# Patient Record
Sex: Male | Born: 1960 | Race: White | Hispanic: No | Marital: Married | Smoking: Current every day smoker
Health system: Southern US, Community
[De-identification: ages and names within clinical notes are randomized; demographics above are authoritative.]

## PROBLEM LIST (undated history)

## (undated) DIAGNOSIS — Z86010 Personal history of colonic polyps: Secondary | ICD-10-CM

## (undated) DIAGNOSIS — I10 Essential (primary) hypertension: Secondary | ICD-10-CM

## (undated) HISTORY — PX: OTHER SURGICAL HISTORY: SHX169

## (undated) HISTORY — DX: Personal history of colonic polyps: Z86.010

## (undated) HISTORY — PX: EYE SURGERY: SHX253

## (undated) HISTORY — DX: Essential (primary) hypertension: I10

---

## 2005-01-16 ENCOUNTER — Emergency Department (HOSPITAL_COMMUNITY): Admission: EM | Admit: 2005-01-16 | Discharge: 2005-01-16 | Payer: Self-pay | Admitting: Emergency Medicine

## 2005-05-30 ENCOUNTER — Ambulatory Visit: Payer: Self-pay | Admitting: Internal Medicine

## 2005-06-06 ENCOUNTER — Ambulatory Visit: Payer: Self-pay | Admitting: Internal Medicine

## 2005-09-06 ENCOUNTER — Ambulatory Visit: Payer: Self-pay | Admitting: Internal Medicine

## 2007-04-02 ENCOUNTER — Ambulatory Visit: Payer: Self-pay | Admitting: Internal Medicine

## 2007-04-02 LAB — CONVERTED CEMR LAB
HCV Ab: NEGATIVE
Hep B Core Total Ab: NEGATIVE
Hep B S Ab: NEGATIVE
Hepatitis B Surface Ag: NEGATIVE

## 2007-12-18 ENCOUNTER — Ambulatory Visit: Payer: Self-pay | Admitting: Internal Medicine

## 2007-12-18 DIAGNOSIS — R21 Rash and other nonspecific skin eruption: Secondary | ICD-10-CM | POA: Insufficient documentation

## 2007-12-18 DIAGNOSIS — N529 Male erectile dysfunction, unspecified: Secondary | ICD-10-CM

## 2007-12-18 DIAGNOSIS — F17200 Nicotine dependence, unspecified, uncomplicated: Secondary | ICD-10-CM

## 2008-02-25 ENCOUNTER — Ambulatory Visit: Payer: Self-pay | Admitting: Internal Medicine

## 2008-02-25 DIAGNOSIS — R062 Wheezing: Secondary | ICD-10-CM | POA: Insufficient documentation

## 2008-02-28 LAB — CONVERTED CEMR LAB
ALT: 20 units/L (ref 0–53)
AST: 23 units/L (ref 0–37)
Alkaline Phosphatase: 64 units/L (ref 39–117)
Basophils Absolute: 0 10*3/uL (ref 0.0–0.1)
Bilirubin, Direct: 0.1 mg/dL (ref 0.0–0.3)
CO2: 29 meq/L (ref 19–32)
Chloride: 106 meq/L (ref 96–112)
Creatinine, Ser: 1.1 mg/dL (ref 0.4–1.5)
Eosinophils Absolute: 0.1 10*3/uL (ref 0.0–0.7)
GFR calc non Af Amer: 77 mL/min
Lymphocytes Relative: 34.4 % (ref 12.0–46.0)
MCHC: 35.8 g/dL (ref 30.0–36.0)
MCV: 95.3 fL (ref 78.0–100.0)
Neutrophils Relative %: 57.3 % (ref 43.0–77.0)
Platelets: 197 10*3/uL (ref 150–400)
Potassium: 4.5 meq/L (ref 3.5–5.1)
RBC: 4.85 M/uL (ref 4.22–5.81)
RDW: 11.9 % (ref 11.5–14.6)
Sodium: 140 meq/L (ref 135–145)
Total Bilirubin: 0.6 mg/dL (ref 0.3–1.2)
Vitamin B-12: 328 pg/mL (ref 211–911)

## 2008-03-24 ENCOUNTER — Ambulatory Visit: Payer: Self-pay | Admitting: Internal Medicine

## 2008-03-24 DIAGNOSIS — G4700 Insomnia, unspecified: Secondary | ICD-10-CM | POA: Insufficient documentation

## 2008-03-24 DIAGNOSIS — K219 Gastro-esophageal reflux disease without esophagitis: Secondary | ICD-10-CM

## 2008-04-02 ENCOUNTER — Ambulatory Visit: Payer: Self-pay | Admitting: Internal Medicine

## 2008-04-02 DIAGNOSIS — I10 Essential (primary) hypertension: Secondary | ICD-10-CM | POA: Insufficient documentation

## 2008-06-18 ENCOUNTER — Ambulatory Visit: Payer: Self-pay | Admitting: Internal Medicine

## 2008-09-16 ENCOUNTER — Ambulatory Visit: Payer: Self-pay | Admitting: Internal Medicine

## 2008-09-16 LAB — CONVERTED CEMR LAB
Chloride: 108 meq/L (ref 96–112)
GFR calc Af Amer: 117 mL/min
Glucose, Bld: 101 mg/dL — ABNORMAL HIGH (ref 70–99)
Potassium: 4.2 meq/L (ref 3.5–5.1)
Sodium: 139 meq/L (ref 135–145)

## 2008-09-17 ENCOUNTER — Ambulatory Visit: Payer: Self-pay | Admitting: Internal Medicine

## 2009-01-16 ENCOUNTER — Ambulatory Visit: Payer: Self-pay | Admitting: Internal Medicine

## 2009-04-08 ENCOUNTER — Ambulatory Visit: Payer: Self-pay | Admitting: Internal Medicine

## 2009-04-08 DIAGNOSIS — K6289 Other specified diseases of anus and rectum: Secondary | ICD-10-CM

## 2009-10-02 ENCOUNTER — Ambulatory Visit: Payer: Self-pay | Admitting: Internal Medicine

## 2009-10-02 LAB — CONVERTED CEMR LAB
ALT: 21 units/L (ref 0–53)
Albumin: 4.3 g/dL (ref 3.5–5.2)
BUN: 8 mg/dL (ref 6–23)
Basophils Relative: 0.1 % (ref 0.0–3.0)
CO2: 26 meq/L (ref 19–32)
Chloride: 104 meq/L (ref 96–112)
Cholesterol: 186 mg/dL (ref 0–200)
Direct LDL: 115.6 mg/dL
Eosinophils Absolute: 0.3 10*3/uL (ref 0.0–0.7)
Eosinophils Relative: 5.6 % — ABNORMAL HIGH (ref 0.0–5.0)
HCT: 48.4 % (ref 39.0–52.0)
Ketones, ur: NEGATIVE mg/dL
Leukocytes, UA: NEGATIVE
Lymphs Abs: 1.5 10*3/uL (ref 0.7–4.0)
MCHC: 34.3 g/dL (ref 30.0–36.0)
MCV: 100.4 fL — ABNORMAL HIGH (ref 78.0–100.0)
Monocytes Absolute: 0.5 10*3/uL (ref 0.1–1.0)
Potassium: 4.3 meq/L (ref 3.5–5.1)
RBC: 4.83 M/uL (ref 4.22–5.81)
Specific Gravity, Urine: 1.025 (ref 1.000–1.030)
TSH: 1.77 microintl units/mL (ref 0.35–5.50)
Total Protein, Urine: NEGATIVE mg/dL
Total Protein: 7.2 g/dL (ref 6.0–8.3)
Urine Glucose: NEGATIVE mg/dL
WBC: 4.5 10*3/uL (ref 4.5–10.5)
pH: 5.5 (ref 5.0–8.0)

## 2009-10-06 ENCOUNTER — Ambulatory Visit: Payer: Self-pay | Admitting: Internal Medicine

## 2009-10-06 DIAGNOSIS — N486 Induration penis plastica: Secondary | ICD-10-CM | POA: Insufficient documentation

## 2009-11-06 ENCOUNTER — Encounter: Payer: Self-pay | Admitting: Internal Medicine

## 2009-12-25 ENCOUNTER — Encounter: Payer: Self-pay | Admitting: Internal Medicine

## 2010-09-28 NOTE — Letter (Signed)
Summary: [facility]   Imported By: Lennie Odor 11/26/2009 14:27:33  _____________________________________________________________________  External Attachment:    Type:   Image     Comment:   External Document

## 2010-09-28 NOTE — Letter (Signed)
Summary: [facility]   Imported By: Sherian Rein 01/08/2010 08:32:12  _____________________________________________________________________  External Attachment:    Type:   Image     Comment:   External Document

## 2010-09-28 NOTE — Assessment & Plan Note (Signed)
Summary: CPX / $50 /[employer]   Vital Signs:  Patient profile:   50 year old male Weight:      158 pounds Temp:     97 degrees F oral Pulse rate:   79 / minute BP sitting:   126 / 74  (left arm)  Vitals Entered By: Tora Perches (October 06, 2009 3:00 PM) CC: cpx Is Patient Diabetic? No   CC:  cpx.  History of Present Illness: The patient presents for a wellness examination  C/o ED C/o curvature of penis  Preventive Screening-Counseling & Management  Alcohol-Tobacco     Smoking Status: current     Smoking Cessation Counseling: yes  Current Medications (verified): 1)  Triamcinolone Acetonide 0.5 % Crea (Triamcinolone Acetonide) .... Apply Bid To Affected Area 2)  Vitamin D3 1000 Unit  Tabs (Cholecalciferol) .... 2 Tabs By Mouth Daily 3)  Zolpidem Tartrate 10 Mg  Tabs (Zolpidem Tartrate) .... 1/2 or 1 By Mouth At [facility] Prn 4)  Aspirin 81 Mg  Tbec (Aspirin) .... One By Mouth Every Day 5)  Exforge Hct 5-160-12.5 Mg Tabs (Amlodipine-Valsartan-Hctz) .Marland Kitchen.. 1 By Mouth Qd 6)  Chantix Starting Month Pak 0.5 Mg X 11 & 1 Mg X 42  Misc (Varenicline Tartrate) .... 0.5mg  By Mouth Once Daily For 3 Days, Then Twice Daily For 4 Days and Then 1mg  By Mouth 2 Times Daily 7)  Levitra 20 Mg Tabs (Vardenafil Hcl) .... 1/2-1 Once Daily As Needed 8)  Proctocort 30 Mg Supp (Hydrocortisone Acetate) .Marland Kitchen.. 1 Pr Bid 9)  Proctocream-Hc 2.5 % Crea (Hydrocortisone) .... Use Bid  Allergies: 1)  ! Penicillin V Potassium (Penicillin V Potassium) 2)  Wellbutrin (Bupropion Hcl)  Past History:  Past Medical History: Last updated: 04/02/2008 ED Eczema GERD Hypertension  Past Surgical History: Last updated: 03/24/2008 Denies surgical history  Family History: Last updated: 04/02/2008 Adopted  Social History: Last updated: 03/24/2008 Occupation: Games developer Married Current Smoker 1.5 ppd Alcohol use-yes 4-6 beers  Contraindications/Deferment of Procedures/Staging:    Test/Procedure: FLU VAX  Reason for deferment: patient declined   Review of Systems  The patient denies fever, chest pain, abdominal pain, anorexia, weight loss, weight gain, vision loss, decreased hearing, hoarseness, syncope, dyspnea on exertion, peripheral edema, prolonged cough, headaches, hemoptysis, melena, hematochezia, severe indigestion/heartburn, hematuria, incontinence, genital sores, muscle weakness, suspicious skin lesions, transient blindness, difficulty walking, depression, unusual weight change, abnormal bleeding, enlarged lymph nodes, angioedema, and testicular masses.    Physical Exam  General:  Well-developed,well-nourished,in no acute distress; alert,appropriate and cooperative throughout examination Eyes:  No corneal or conjunctival inflammation noted. EOMI. Perrla.l Ears:  External ear exam shows no significant lesions or deformities.  Otoscopic examination reveals clear canals, tympanic membranes are intact bilaterally without bulging, retraction, inflammation or discharge. Hearing is grossly normal bilaterally. Nose:  External nasal examination shows no deformity or inflammation. Nasal mucosa are pink and moist without lesions or exudates. Mouth:  Oral mucosa and oropharynx without lesions or exudates.  Teeth in good repair. Neck:  No deformities, masses, or tenderness noted. Lungs:  Normal respiratory effort, chest expands symmetrically. Lungs are clear to auscultation, no crackles or wheezes. Heart:  Normal rate and regular rhythm. S1 and S2 normal without gallop, murmur, click, rub or other extra sounds. Abdomen:  Bowel sounds positive,abdomen soft and non-tender without masses, organomegaly or hernias noted. Genitalia:  circumcised and condylomata.   Prostate:  Prostate gland firm and smooth, no enlargement, nodularity, tenderness, mass, asymmetry or induration. Msk:  No deformity or  scoliosis noted of thoracic or lumbar spine.   Pulses:  R and L carotid,radial,femoral,dorsalis pedis and  posterior tibial pulses are full and equal bilaterally Extremities:  No clubbing, cyanosis, edema, or deformity noted with normal full range of motion of all joints.   Neurologic:  No cranial nerve deficits noted. Station and gait are normal. Plantar reflexes are down-going bilaterally. DTRs are symmetrical throughout. Sensory, motor and coordinative functions appear intact. Skin:  Intact without suspicious lesions or rashes Cervical Nodes:  No lymphadenopathy noted Inguinal Nodes:  No significant adenopathy Psych:  Cognition and judgment appear intact. Alert and cooperative with normal attention span and concentration. No apparent delusions, illusions, hallucinations   Impression & Recommendations:  Problem # 1:  WELL ADULT EXAM (ICD-V70.0) Assessment New Health and age related issues were discussed. Available screening tests and vaccinations were discussed as well. Healthy life style including good diet and execise was discussed.  The labs were reviewed with the patient.   Problem # 2:  HYPERTENSION (ICD-401.9)  His updated medication list for this problem includes:    Exforge Hct 5-160-12.5 Mg Tabs (Amlodipine-valsartan-hctz) .Marland Kitchen... 1 by mouth qd  Problem # 3:  ERECTILE DYSFUNCTION (ICD-607.84) Assessment: Deteriorated  The following medications were removed from the medication list:    Levitra 20 Mg Tabs (Vardenafil hcl) .Marland Kitchen... 1/2-1 once daily as needed His updated medication list for this problem includes:    Cialis 20 Mg Tabs (Tadalafil) .Marland Kitchen... 1/2 or 1 by mouth q 1-3 d prn  Orders: Urology Referral (Urology)  Problem # 4:  PEYRONIES DISEASE (HYW-737.10) Assessment: Deteriorated  Orders: Urology Referral (Urology)  Complete Medication List: 1)  Triamcinolone Acetonide 0.5 % Crea (Triamcinolone acetonide) .... Apply bid to affected area 2)  Vitamin D3 1000 Unit Tabs (Cholecalciferol) .... 2 tabs by mouth daily 3)  Zolpidem Tartrate 10 Mg Tabs (Zolpidem tartrate) .... 1/2  or 1 by mouth at hs prn 4)  Aspirin 81 Mg Tbec (Aspirin) .... One by mouth every day 5)  Exforge Hct 5-160-12.5 Mg Tabs (Amlodipine-valsartan-hctz) .Marland Kitchen.. 1 by mouth qd 6)  Proctocort 30 Mg Supp (Hydrocortisone acetate) .Marland Kitchen.. 1 pr bid 7)  Proctocream-hc 2.5 % Crea (Hydrocortisone) .... Use bid 8)  Cialis 20 Mg Tabs (Tadalafil) .... 1/2 or 1 by mouth q 1-3 d prn 9)  Vitamin E 400 Unit Caps (Vitamin e) .Marland Kitchen.. 1 by mouth qd  Patient Instructions: 1)  Please schedule a follow-up appointment in 1 year well w/ labs. 2)  Tobacco is very bad for your health and your loved ones! You Should stop smoking!. 3)  It is not healthy  for men to drink more than 2-3 drinks per day or for women to drink more than 1-2 drinks per day. Prescriptions: EXFORGE HCT 5-160-12.5 MG TABS (AMLODIPINE-VALSARTAN-HCTZ) 1 by mouth qd  #30 x 12   Entered and Authorized by:   Tresa Garter MD   Signed by:   Tresa Garter MD on 10/06/2009   Method used:   Print then Give to Patient   RxID:   [id number] CIALIS 20 MG TABS (TADALAFIL) 1/2 or 1 by mouth q 1-3 d prn  #6 x 12   Entered and Authorized by:   Tresa Garter MD   Signed by:   Tresa Garter MD on 10/06/2009   Method used:   Print then Give to Patient   RxID:   [id number]

## 2011-05-08 ENCOUNTER — Other Ambulatory Visit: Payer: Self-pay | Admitting: Internal Medicine

## 2011-06-17 ENCOUNTER — Other Ambulatory Visit: Payer: Self-pay | Admitting: Internal Medicine

## 2011-08-07 ENCOUNTER — Ambulatory Visit (INDEPENDENT_AMBULATORY_CARE_PROVIDER_SITE_OTHER): Payer: BC Managed Care – PPO

## 2011-08-07 DIAGNOSIS — R221 Localized swelling, mass and lump, neck: Secondary | ICD-10-CM

## 2011-08-07 DIAGNOSIS — R220 Localized swelling, mass and lump, head: Secondary | ICD-10-CM

## 2011-08-07 DIAGNOSIS — K047 Periapical abscess without sinus: Secondary | ICD-10-CM

## 2011-12-28 ENCOUNTER — Other Ambulatory Visit: Payer: Self-pay | Admitting: Internal Medicine

## 2012-02-06 ENCOUNTER — Other Ambulatory Visit: Payer: Self-pay | Admitting: Internal Medicine

## 2012-05-16 ENCOUNTER — Other Ambulatory Visit: Payer: Self-pay | Admitting: Internal Medicine

## 2012-05-18 ENCOUNTER — Other Ambulatory Visit (INDEPENDENT_AMBULATORY_CARE_PROVIDER_SITE_OTHER): Payer: BC Managed Care – PPO

## 2012-05-18 ENCOUNTER — Ambulatory Visit (INDEPENDENT_AMBULATORY_CARE_PROVIDER_SITE_OTHER): Payer: BC Managed Care – PPO | Admitting: Internal Medicine

## 2012-05-18 ENCOUNTER — Other Ambulatory Visit: Payer: Self-pay | Admitting: Internal Medicine

## 2012-05-18 ENCOUNTER — Encounter: Payer: Self-pay | Admitting: Internal Medicine

## 2012-05-18 VITALS — BP 158/100 | HR 80 | Temp 97.4°F | Resp 16 | Ht 70.0 in | Wt 154.0 lb

## 2012-05-18 DIAGNOSIS — R062 Wheezing: Secondary | ICD-10-CM

## 2012-05-18 DIAGNOSIS — Z23 Encounter for immunization: Secondary | ICD-10-CM

## 2012-05-18 DIAGNOSIS — Z0000 Encounter for general adult medical examination without abnormal findings: Secondary | ICD-10-CM

## 2012-05-18 DIAGNOSIS — F17200 Nicotine dependence, unspecified, uncomplicated: Secondary | ICD-10-CM

## 2012-05-18 DIAGNOSIS — I10 Essential (primary) hypertension: Secondary | ICD-10-CM

## 2012-05-18 LAB — TSH: TSH: 1.63 u[IU]/mL (ref 0.35–5.50)

## 2012-05-18 LAB — CBC WITH DIFFERENTIAL/PLATELET
Basophils Relative: 0.3 % (ref 0.0–3.0)
Eosinophils Relative: 1.4 % (ref 0.0–5.0)
Hemoglobin: 16.5 g/dL (ref 13.0–17.0)
Lymphocytes Relative: 35.1 % (ref 12.0–46.0)
MCHC: 34.4 g/dL (ref 30.0–36.0)
Monocytes Relative: 8.4 % (ref 3.0–12.0)
Neutro Abs: 2.7 10*3/uL (ref 1.4–7.7)
RBC: 4.87 Mil/uL (ref 4.22–5.81)
WBC: 5 10*3/uL (ref 4.5–10.5)

## 2012-05-18 LAB — URINALYSIS
Leukocytes, UA: NEGATIVE
Nitrite: NEGATIVE
Specific Gravity, Urine: <=1.005 (ref 1.000–1.030)
Urobilinogen, UA: 0.2 (ref 0.0–1.0)
pH: 6.5 (ref 5.0–8.0)

## 2012-05-18 LAB — LIPID PANEL
HDL: 34.3 mg/dL — ABNORMAL LOW (ref >39.00)
LDL Cholesterol: 113 mg/dL — ABNORMAL HIGH (ref 0–99)
Total CHOL/HDL Ratio: 5
Triglycerides: 131 mg/dL (ref 0.0–149.0)
VLDL: 26.2 mg/dL (ref 0.0–40.0)

## 2012-05-18 LAB — HEPATIC FUNCTION PANEL
Albumin: 4.2 g/dL (ref 3.5–5.2)
Total Bilirubin: 0.7 mg/dL (ref 0.3–1.2)

## 2012-05-18 LAB — BASIC METABOLIC PANEL
CO2: 26 mEq/L (ref 19–32)
Calcium: 9.6 mg/dL (ref 8.4–10.5)
GFR: 110.62 mL/min (ref >60.00)
Sodium: 137 mEq/L (ref 135–145)

## 2012-05-18 MED ORDER — ASPIRIN 325 MG PO TABS: 325.0000 mg | ORAL_TABLET | Freq: Every day | ORAL | Status: DC

## 2012-05-18 MED ORDER — VITAMIN D 1000 UNITS PO TABS: 1000.0000 [IU] | ORAL_TABLET | Freq: Every day | ORAL | Status: AC

## 2012-05-18 MED ORDER — LOSARTAN POTASSIUM-HCTZ 100-12.5 MG PO TABS: 1.0000 | ORAL_TABLET | Freq: Every day | ORAL | Status: DC

## 2012-05-18 MED ORDER — AMLODIPINE BESYLATE 5 MG PO TABS: 5.0000 mg | ORAL_TABLET | Freq: Every day | ORAL | Status: DC

## 2012-05-18 MED ORDER — VARENICLINE TARTRATE 1 MG PO TABS: 1.0000 mg | ORAL_TABLET | Freq: Two times a day (BID) | ORAL | Status: DC

## 2012-05-18 MED ORDER — VARENICLINE TARTRATE 0.5 MG X 11 & 1 MG X 42 PO MISC: ORAL | Status: DC

## 2012-05-18 NOTE — Assessment & Plan Note (Signed)
Discussed Chantix Rx given 

## 2012-05-18 NOTE — Assessment & Plan Note (Signed)
Declined Rx, CXR Stop smokig

## 2012-05-18 NOTE — Progress Notes (Signed)
Subjective:    Patient ID: Kyle Stafford, male    DOB: 04/07/1961, 51 y.o.   MRN: [id number]  HPI  The patient is here for a wellness exam. The patient has been doing well overall without major physical or psychological issues going on lately. Not seen in 3 years The patient needs to address  chronic hypertension that has been well controlled with medicines  BP Readings from Last 3 Encounters:  05/18/12 158/100  10/06/09 126/74  04/08/09 116/58   Wt Readings from Last 3 Encounters:  05/18/12 154 lb (69.854 kg)  10/06/09 158 lb (71.668 kg)  04/08/09 162 lb (73.483 kg)     Review of Systems  Constitutional: Negative for appetite change, fatigue and unexpected weight change.  HENT: Positive for congestion. Negative for nosebleeds, sore throat, sneezing, trouble swallowing and neck pain.   Eyes: Negative for itching and visual disturbance.  Respiratory: Positive for cough.   Cardiovascular: Negative for chest pain, palpitations and leg swelling.  Gastrointestinal: Negative for nausea, diarrhea, blood in stool and abdominal distention.  Genitourinary: Negative for frequency and hematuria.  Musculoskeletal: Negative for back pain, joint swelling and gait problem.  Skin: Negative for rash.  Neurological: Negative for dizziness, tremors, speech difficulty and weakness.  Psychiatric/Behavioral: Negative for disturbed wake/sleep cycle, dysphoric mood and agitation. The patient is not nervous/anxious.        Objective:   Physical Exam  Constitutional: He is oriented to person, place, and time. He appears well-developed and well-nourished. No distress.  HENT:  Head: Normocephalic and atraumatic.  Right Ear: External ear normal.  Left Ear: External ear normal.  Nose: Nose normal.  Mouth/Throat: Oropharynx is clear and moist. No oropharyngeal exudate.  Eyes: Conjunctivae normal and EOM are normal. Pupils are equal, round, and reactive to light. Right eye exhibits no discharge. Left  eye exhibits no discharge. No scleral icterus.  Neck: Normal range of motion. Neck supple. No JVD present. No tracheal deviation present. No thyromegaly present.  Cardiovascular: Normal rate, regular rhythm, normal heart sounds and intact distal pulses.  Exam reveals no gallop and no friction rub.   No murmur heard. Pulmonary/Chest: Effort normal. No stridor. No respiratory distress. He has wheezes (B). He has no rales. He exhibits no tenderness.  Abdominal: Soft. Bowel sounds are normal. He exhibits no distension and no mass. There is no tenderness. There is no rebound and no guarding.  Genitourinary:       He declined rectal, gen exam  Musculoskeletal: Normal range of motion. He exhibits no edema and no tenderness.  Lymphadenopathy:    He has no cervical adenopathy.  Neurological: He is alert and oriented to person, place, and time. He has normal reflexes. No cranial nerve deficit. He exhibits normal muscle tone. Coordination normal.  Skin: Skin is warm and dry. No rash noted. He is not diaphoretic. No erythema. No pallor.  Psychiatric: He has a normal mood and affect. His behavior is normal. Judgment and thought content normal.    Lab Results  Component Value Date   WBC 4.5 10/02/2009   HGB 16.6 10/02/2009   HCT 48.4 10/02/2009   PLT 269.0 10/02/2009   GLUCOSE 88 10/02/2009   CHOL 186 10/02/2009   TRIG 211.0* 10/02/2009   HDL 40.70 10/02/2009   LDLDIRECT 115.6 10/02/2009   ALT 21 10/02/2009   AST 20 10/02/2009   NA 136 10/02/2009   K 4.3 10/02/2009   CL 104 10/02/2009   CREATININE 0.8 10/02/2009   BUN 8 10/02/2009  CO2 26 10/02/2009   TSH 1.77 10/02/2009   PSA 0.93 10/02/2009         Assessment & Plan:

## 2012-05-18 NOTE — Assessment & Plan Note (Addendum)
See med change due to cost NAS diet Drink less beer, stop smoking

## 2012-05-21 NOTE — Telephone Encounter (Signed)
Please advise. Med is not active.

## 2012-11-16 ENCOUNTER — Ambulatory Visit: Payer: BC Managed Care – PPO | Admitting: Internal Medicine

## 2012-11-21 ENCOUNTER — Encounter: Payer: Self-pay | Admitting: Internal Medicine

## 2012-11-21 ENCOUNTER — Ambulatory Visit (INDEPENDENT_AMBULATORY_CARE_PROVIDER_SITE_OTHER): Payer: BC Managed Care – PPO | Admitting: Internal Medicine

## 2012-11-21 VITALS — BP 116/64 | HR 76 | Temp 98.2°F | Resp 16 | Wt 154.0 lb

## 2012-11-21 DIAGNOSIS — I10 Essential (primary) hypertension: Secondary | ICD-10-CM

## 2012-11-21 DIAGNOSIS — N486 Induration penis plastica: Secondary | ICD-10-CM

## 2012-11-21 DIAGNOSIS — F17200 Nicotine dependence, unspecified, uncomplicated: Secondary | ICD-10-CM

## 2012-11-21 DIAGNOSIS — N529 Male erectile dysfunction, unspecified: Secondary | ICD-10-CM

## 2012-11-21 DIAGNOSIS — K219 Gastro-esophageal reflux disease without esophagitis: Secondary | ICD-10-CM

## 2012-11-21 MED ORDER — LOSARTAN POTASSIUM-HCTZ 100-12.5 MG PO TABS: 1.0000 | ORAL_TABLET | Freq: Every day | ORAL | Status: DC

## 2012-11-21 MED ORDER — AMLODIPINE BESYLATE 5 MG PO TABS: 5.0000 mg | ORAL_TABLET | Freq: Every day | ORAL | Status: DC

## 2012-11-21 NOTE — Progress Notes (Signed)
   Subjective:      HPI   The patient needs to address  chronic hypertension that has been well controlled with medicines. F/u GERD, Peyronie's.  F/o tobacco smoking - he was unable to quit   Review of Systems  Constitutional: Negative for appetite change, fatigue and unexpected weight change.  HENT: Positive for congestion. Negative for nosebleeds, sore throat, sneezing, trouble swallowing and neck pain.   Eyes: Negative for itching and visual disturbance.  Respiratory: Positive for cough.   Cardiovascular: Negative for chest pain, palpitations and leg swelling.  Gastrointestinal: Negative for nausea, diarrhea, blood in stool and abdominal distention.  Genitourinary: Negative for frequency and hematuria.  Musculoskeletal: Negative for back pain, joint swelling and gait problem.  Skin: Negative for rash.  Neurological: Negative for dizziness, tremors, speech difficulty and weakness.  Psychiatric/Behavioral: Negative for disturbed wake/sleep cycle, dysphoric mood and agitation. The patient is not nervous/anxious.        Objective:   Physical Exam  Constitutional: He is oriented to person, place, and time. He appears well-developed and well-nourished. No distress.  HENT:  Head: Normocephalic and atraumatic.  Right Ear: External ear normal.  Left Ear: External ear normal.  Nose: Nose normal.  Mouth/Throat: Oropharynx is clear and moist. No oropharyngeal exudate.  Eyes: Conjunctivae normal and EOM are normal. Pupils are equal, round, and reactive to light. Right eye exhibits no discharge. Left eye exhibits no discharge. No scleral icterus.  Neck: Normal range of motion. Neck supple. No JVD present. No tracheal deviation present. No thyromegaly present.  Cardiovascular: Normal rate, regular rhythm, normal heart sounds and intact distal pulses.  Exam reveals no gallop and no friction rub.   No murmur heard. Pulmonary/Chest: Effort normal. No stridor. No respiratory distress. He has  wheezes (B). He has no rales. He exhibits no tenderness.  Abdominal: Soft. Bowel sounds are normal. He exhibits no distension and no mass. There is no tenderness. There is no rebound and no guarding.  Genitourinary:       He declined rectal, gen exam  Musculoskeletal: Normal range of motion. He exhibits no edema and no tenderness.  Lymphadenopathy:    He has no cervical adenopathy.  Neurological: He is alert and oriented to person, place, and time. He has normal reflexes. No cranial nerve deficit. He exhibits normal muscle tone. Coordination normal.  Skin: Skin is warm and dry. No rash noted. He is not diaphoretic. No erythema. No pallor.  Psychiatric: He has a normal mood and affect. His behavior is normal. Judgment and thought content normal.             Assessment & Plan:

## 2012-11-21 NOTE — Assessment & Plan Note (Signed)
Prn meds 

## 2012-11-21 NOTE — Assessment & Plan Note (Signed)
Continue with current prescription therapy as reflected on the Med list.  

## 2012-11-21 NOTE — Assessment & Plan Note (Signed)
Discussed.

## 2012-11-21 NOTE — Assessment & Plan Note (Signed)
Info given 

## 2012-12-05 ENCOUNTER — Other Ambulatory Visit: Payer: Self-pay | Admitting: Internal Medicine

## 2012-12-05 DIAGNOSIS — Z0000 Encounter for general adult medical examination without abnormal findings: Secondary | ICD-10-CM

## 2012-12-05 DIAGNOSIS — Z125 Encounter for screening for malignant neoplasm of prostate: Secondary | ICD-10-CM

## 2013-05-15 ENCOUNTER — Other Ambulatory Visit (INDEPENDENT_AMBULATORY_CARE_PROVIDER_SITE_OTHER): Payer: BC Managed Care – PPO

## 2013-05-15 DIAGNOSIS — Z125 Encounter for screening for malignant neoplasm of prostate: Secondary | ICD-10-CM

## 2013-05-15 DIAGNOSIS — Z0000 Encounter for general adult medical examination without abnormal findings: Secondary | ICD-10-CM

## 2013-05-15 LAB — URINALYSIS, ROUTINE W REFLEX MICROSCOPIC
Bilirubin Urine: NEGATIVE
Hgb urine dipstick: NEGATIVE
Ketones, ur: NEGATIVE
Total Protein, Urine: NEGATIVE
Urine Glucose: NEGATIVE
Urobilinogen, UA: 1 (ref 0.0–1.0)

## 2013-05-15 LAB — COMPREHENSIVE METABOLIC PANEL
ALT: 14 U/L (ref 0–53)
Albumin: 4.1 g/dL (ref 3.5–5.2)
Alkaline Phosphatase: 57 U/L (ref 39–117)
CO2: 26 mEq/L (ref 19–32)
Glucose, Bld: 100 mg/dL — ABNORMAL HIGH (ref 70–99)
Potassium: 4.4 mEq/L (ref 3.5–5.1)
Sodium: 133 mEq/L — ABNORMAL LOW (ref 135–145)
Total Bilirubin: 0.4 mg/dL (ref 0.3–1.2)
Total Protein: 6.5 g/dL (ref 6.0–8.3)

## 2013-05-15 LAB — LIPID PANEL
Cholesterol: 167 mg/dL (ref 0–200)
HDL: 32.8 mg/dL — ABNORMAL LOW (ref >39.00)
Total CHOL/HDL Ratio: 5
Triglycerides: 371 mg/dL — ABNORMAL HIGH (ref 0.0–149.0)
VLDL: 74.2 mg/dL — ABNORMAL HIGH (ref 0.0–40.0)

## 2013-05-15 LAB — CBC WITH DIFFERENTIAL/PLATELET
Basophils Absolute: 0 10*3/uL (ref 0.0–0.1)
Eosinophils Relative: 2.6 % (ref 0.0–5.0)
HCT: 44.8 % (ref 39.0–52.0)
Lymphs Abs: 1.6 10*3/uL (ref 0.7–4.0)
Monocytes Relative: 8.3 % (ref 3.0–12.0)
Neutrophils Relative %: 61 % (ref 43.0–77.0)
Platelets: 198 10*3/uL (ref 150.0–400.0)
RDW: 12.3 % (ref 11.5–14.6)
WBC: 5.9 10*3/uL (ref 4.5–10.5)

## 2013-05-15 LAB — LDL CHOLESTEROL, DIRECT: Direct LDL: 104.1 mg/dL

## 2013-05-21 ENCOUNTER — Ambulatory Visit (INDEPENDENT_AMBULATORY_CARE_PROVIDER_SITE_OTHER): Payer: BC Managed Care – PPO | Admitting: Internal Medicine

## 2013-05-21 ENCOUNTER — Encounter: Payer: Self-pay | Admitting: Internal Medicine

## 2013-05-21 VITALS — BP 158/92 | HR 76 | Temp 97.7°F | Resp 16 | Ht 70.0 in | Wt 155.0 lb

## 2013-05-21 DIAGNOSIS — Z0000 Encounter for general adult medical examination without abnormal findings: Secondary | ICD-10-CM

## 2013-05-21 DIAGNOSIS — I10 Essential (primary) hypertension: Secondary | ICD-10-CM

## 2013-05-21 DIAGNOSIS — Z1211 Encounter for screening for malignant neoplasm of colon: Secondary | ICD-10-CM

## 2013-05-21 MED ORDER — VARENICLINE TARTRATE 0.5 MG X 11 & 1 MG X 42 PO MISC: ORAL | Status: DC

## 2013-05-21 MED ORDER — VARENICLINE TARTRATE 1 MG PO TABS: 1.0000 mg | ORAL_TABLET | Freq: Two times a day (BID) | ORAL | Status: DC

## 2013-05-21 MED ORDER — AMLODIPINE BESYLATE 5 MG PO TABS: 5.0000 mg | ORAL_TABLET | Freq: Every day | ORAL | Status: DC

## 2013-05-21 MED ORDER — LOSARTAN POTASSIUM-HCTZ 100-12.5 MG PO TABS: 1.0000 | ORAL_TABLET | Freq: Every day | ORAL | Status: DC

## 2013-05-21 NOTE — Progress Notes (Signed)
Subjective:   HPI  The patient is here for a wellness exam. The patient has been doing well overall without major physical or psychological issues going on lately.   The patient needs to address  chronic hypertension that has been well controlled with medicines  BP Readings from Last 3 Encounters:  05/21/13 158/92  11/21/12 116/64  05/18/12 158/100   Wt Readings from Last 3 Encounters:  05/21/13 155 lb (70.308 kg)  11/21/12 154 lb (69.854 kg)  05/18/12 154 lb (69.854 kg)     Review of Systems  Constitutional: Negative for appetite change, fatigue and unexpected weight change.  HENT: Positive for congestion. Negative for nosebleeds, sore throat, sneezing, trouble swallowing and neck pain.   Eyes: Negative for itching and visual disturbance.  Respiratory: Positive for cough.   Cardiovascular: Negative for chest pain, palpitations and leg swelling.  Gastrointestinal: Negative for nausea, diarrhea, blood in stool and abdominal distention.  Genitourinary: Negative for frequency and hematuria.  Musculoskeletal: Negative for back pain, joint swelling and gait problem.  Skin: Negative for rash.  Neurological: Negative for dizziness, tremors, speech difficulty and weakness.  Psychiatric/Behavioral: Negative for sleep disturbance, dysphoric mood and agitation. The patient is not nervous/anxious.        Objective:   Physical Exam  Constitutional: He is oriented to person, place, and time. He appears well-developed and well-nourished. No distress.  HENT:  Head: Normocephalic and atraumatic.  Right Ear: External ear normal.  Left Ear: External ear normal.  Nose: Nose normal.  Mouth/Throat: Oropharynx is clear and moist. No oropharyngeal exudate.  Eyes: Conjunctivae and EOM are normal. Pupils are equal, round, and reactive to light. Right eye exhibits no discharge. Left eye exhibits no discharge. No scleral icterus.  Neck: Normal range of motion. Neck supple. No JVD present. No  tracheal deviation present. No thyromegaly present.  Cardiovascular: Normal rate, regular rhythm, normal heart sounds and intact distal pulses.  Exam reveals no gallop and no friction rub.   No murmur heard. Pulmonary/Chest: Effort normal. No stridor. No respiratory distress. He has wheezes (B). He has no rales. He exhibits no tenderness.  Abdominal: Soft. Bowel sounds are normal. He exhibits no distension and no mass. There is no tenderness. There is no rebound and no guarding.  Genitourinary:  He declined rectal, gen exam  Musculoskeletal: Normal range of motion. He exhibits no edema and no tenderness.  Lymphadenopathy:    He has no cervical adenopathy.  Neurological: He is alert and oriented to person, place, and time. He has normal reflexes. No cranial nerve deficit. He exhibits normal muscle tone. Coordination normal.  Skin: Skin is warm and dry. No rash noted. He is not diaphoretic. No erythema. No pallor.  Psychiatric: He has a normal mood and affect. His behavior is normal. Judgment and thought content normal.    Lab Results  Component Value Date   WBC 5.9 05/15/2013   HGB 15.7 05/15/2013   HCT 44.8 05/15/2013   PLT 198.0 05/15/2013   GLUCOSE 100* 05/15/2013   CHOL 167 05/15/2013   TRIG 371.0* 05/15/2013   HDL 32.80* 05/15/2013   LDLDIRECT 104.1 05/15/2013   LDLCALC 113* 05/18/2012   ALT 14 05/15/2013   AST 17 05/15/2013   NA 133* 05/15/2013   K 4.4 05/15/2013   CL 100 05/15/2013   CREATININE 0.9 05/15/2013   BUN 14 05/15/2013   CO2 26 05/15/2013   TSH 1.13 05/15/2013   PSA 0.72 05/15/2013  Assessment & Plan:

## 2013-05-21 NOTE — Assessment & Plan Note (Addendum)
Continue with current prescription therapy as reflected on the Med list. Check BP at home NAS diet

## 2013-05-21 NOTE — Assessment & Plan Note (Signed)
We discussed age appropriate health related issues, including available/recomended screening tests and vaccinations. We discussed a need for adhering to healthy diet and exercise. Labs were ordered. He declined EKG, CXR, gen and rectal exam, flue shot.  All questions were answered.  Declined vaccines Colonoscopy

## 2013-05-22 ENCOUNTER — Encounter: Payer: Self-pay | Admitting: Internal Medicine

## 2013-05-29 ENCOUNTER — Encounter: Payer: Self-pay | Admitting: Internal Medicine

## 2013-06-03 ENCOUNTER — Ambulatory Visit (AMBULATORY_SURGERY_CENTER): Payer: Self-pay

## 2013-06-03 VITALS — Ht 70.0 in | Wt 150.0 lb

## 2013-06-03 DIAGNOSIS — Z1211 Encounter for screening for malignant neoplasm of colon: Secondary | ICD-10-CM

## 2013-06-03 MED ORDER — SUPREP BOWEL PREP KIT 17.5-3.13-1.6 GM/177ML PO SOLN: 1.0000 | Freq: Once | ORAL | Status: DC

## 2013-06-04 ENCOUNTER — Encounter: Payer: Self-pay | Admitting: Internal Medicine

## 2013-06-06 ENCOUNTER — Telehealth: Payer: Self-pay | Admitting: Internal Medicine

## 2013-06-07 NOTE — Telephone Encounter (Signed)
No charge. 

## 2013-06-10 ENCOUNTER — Encounter: Payer: BC Managed Care – PPO | Admitting: Internal Medicine

## 2013-07-04 ENCOUNTER — Encounter: Payer: Self-pay | Admitting: Internal Medicine

## 2013-07-04 ENCOUNTER — Ambulatory Visit (AMBULATORY_SURGERY_CENTER): Payer: BC Managed Care – PPO | Admitting: Internal Medicine

## 2013-07-04 VITALS — BP 122/69 | HR 56 | Temp 97.1°F | Resp 20 | Ht 70.0 in | Wt 150.0 lb

## 2013-07-04 DIAGNOSIS — D126 Benign neoplasm of colon, unspecified: Secondary | ICD-10-CM

## 2013-07-04 DIAGNOSIS — Z1211 Encounter for screening for malignant neoplasm of colon: Secondary | ICD-10-CM

## 2013-07-04 MED ORDER — SODIUM CHLORIDE 0.9 % IV SOLN: 500.0000 mL | INTRAVENOUS | Status: DC

## 2013-07-04 NOTE — Progress Notes (Signed)
Lidocaine-40mg IV prior to Propofol InductionPropofol given over incremental dosages 

## 2013-07-04 NOTE — Op Note (Signed)
[facility] [address] [place], [address]   COLONOSCOPY PROCEDURE REPORT  PATIENT: Kyle, Stafford.  MR#: [id number] BIRTHDATE: 1961/05/17 , 52  yrs. old GENDER: Male ENDOSCOPIST: Iva Boop, MD, [facility] REFERRED QM:VHQI Buckner Malta, M.D. PROCEDURE DATE:  07/04/2013 PROCEDURE:   Colonoscopy with biopsy and snare polypectomy First Screening Colonoscopy - Avg.  risk and is 50 yrs.  old or older Yes.  Prior Negative Screening - Now for repeat screening. N/A  History of Adenoma - Now for follow-up colonoscopy & has been > or = to 3 yrs.  N/A  Polyps Removed Today? Yes. ASA CLASS:   Class II INDICATIONS:average risk screening and first colonoscopy. MEDICATIONS: propofol (Diprivan) 300mg  IV, MAC sedation, administered by CRNA, and These medications were titrated to patient response per physician's verbal order  DESCRIPTION OF PROCEDURE:   After the risks benefits and alternatives of the procedure were thoroughly explained, informed consent was obtained.  A digital rectal exam revealed no abnormalities of the rectum, A digital rectal exam revealed no prostatic nodules, and A digital rectal exam revealed the prostate was not enlarged.   The [id number]  endoscope was introduced through the anus and advanced to the cecum, which was identified by both the appendix and ileocecal valve. No adverse events experienced.   The quality of the prep was excellent using Suprep  The instrument was then slowly withdrawn as the colon was fully examined.   COLON FINDINGS: Two diminutive sessile polyps were found at the splenic flexure and in the sigmoid colon.  A polypectomy was performed with cold forceps (sigmoid) and with a cold snare (splenic flexure).  The resection was complete and the polyp tissue was completely retrieved.   The colon mucosa was otherwise normal. A right colon retroflexion was performed.  Retroflexed views revealed no abnormalities. The time to  cecum=1 minutes 08 seconds. Withdrawal time=12 minutes 19 seconds.  The scope was withdrawn and the procedure completed. COMPLICATIONS: There were no complications.  ENDOSCOPIC IMPRESSION: 1.   Two diminutive sessile polyps were found at the splenic flexure and in the sigmoid colon; polypectomy was performed with cold forceps and with a cold snare 2.   The colon mucosa was otherwise normal  RECOMMENDATIONS: Timing of repeat colonoscopy will be determined by pathology findings.  eSigned:  Iva Boop, MD, [facility] 07/04/2013 10:29 AM   cc: Linda Hedges.  Plotnikov, MD and The Patient

## 2013-07-04 NOTE — Progress Notes (Signed)
Patient did not experience any of the following events: a burn prior to discharge; a fall within the facility; wrong site/side/patient/procedure/implant event; or a hospital transfer or hospital admission upon discharge from the facility. (G8907) Patient did not have preoperative order for IV antibiotic SSI prophylaxis. ([id number])  

## 2013-07-04 NOTE — Progress Notes (Signed)
Called to room to assist during endoscopic procedure.  Patient ID and intended procedure confirmed with present staff. Received instructions for my participation in the procedure from the performing physician. ewm 

## 2013-07-04 NOTE — Patient Instructions (Addendum)
I found and removed two very small polyps that look benign.  I will let you know pathology results and when to have another routine colonoscopy by mail.  It is the time of year to have a vaccination to prevent the flu (influenza virus). Please have this done through your primary care provider or you can get this done at local pharmacies or the [facility]. It would be very helpful if you notify your primary care provider when and where you had the vaccination given by messaging them in My Chart, leaving a message or faxing the information.  I appreciate the opportunity to care for you. Iva Boop, MD, FACG  YOU HAD AN ENDOSCOPIC PROCEDURE TODAY AT THE [facility]: Refer to the procedure report that was given to you for any specific questions about what was found during the examination.  If the procedure report does not answer your questions, please call your gastroenterologist to clarify.  If you requested that your care partner not be given the details of your procedure findings, then the procedure report has been included in a sealed envelope for you to review at your convenience later.  YOU SHOULD EXPECT: Some feelings of bloating in the abdomen. Passage of more gas than usual.  Walking can help get rid of the air that was put into your GI tract during the procedure and reduce the bloating. If you had a lower endoscopy (such as a colonoscopy or flexible sigmoidoscopy) you may notice spotting of blood in your stool or on the toilet paper. If you underwent a bowel prep for your procedure, then you may not have a normal bowel movement for a few days.  DIET: Your first meal following the procedure should be a light meal and then it is ok to progress to your normal diet.  A half-sandwich or bowl of soup is an example of a good first meal.  Heavy or fried foods are harder to digest and may make you feel nauseous or bloated.  Likewise meals heavy in dairy and vegetables can cause extra  gas to form and this can also increase the bloating.  Drink plenty of fluids but you should avoid alcoholic beverages for 24 hours.  ACTIVITY: Your care partner should take you home directly after the procedure.  You should plan to take it easy, moving slowly for the rest of the day.  You can resume normal activity the day after the procedure however you should NOT DRIVE or use heavy machinery for 24 hours (because of the sedation medicines used during the test).    SYMPTOMS TO REPORT IMMEDIATELY: A gastroenterologist can be reached at any hour.  During normal business hours, 8:30 AM to 5:00 PM Monday through Friday, call [phone].  After hours and on weekends, please call the GI answering service at [phone] who will take a message and have the physician on call contact you.   Following lower endoscopy (colonoscopy or flexible sigmoidoscopy):  Excessive amounts of blood in the stool  Significant tenderness or worsening of abdominal pains  Swelling of the abdomen that is new, acute  Fever of 100F or higher   FOLLOW UP: If any biopsies were taken you will be contacted by phone or by letter within the next 1-3 weeks.  Call your gastroenterologist if you have not heard about the biopsies in 3 weeks.  Our staff will call the home number listed on your records the next business day following your procedure to check on  you and address any questions or concerns that you may have at that time regarding the information given to you following your procedure. This is a courtesy call and so if there is no answer at the home number and we have not heard from you through the emergency physician on call, we will assume that you have returned to your regular daily activities without incident.  SIGNATURES/CONFIDENTIALITY: You and/or your care partner have signed paperwork which will be entered into your electronic medical record.  These signatures attest to the fact that that the information above on  your After Visit Summary has been reviewed and is understood.  Full responsibility of the confidentiality of this discharge information lies with you and/or your care-partner.  Polyps-handout given  Repeat colonoscopy will be determined by pathology.

## 2013-07-05 ENCOUNTER — Telehealth: Payer: Self-pay | Admitting: *Deleted

## 2013-07-05 DIAGNOSIS — Z860100 Personal history of colon polyps, unspecified: Secondary | ICD-10-CM

## 2013-07-05 HISTORY — DX: Personal history of colonic polyps: Z86.010

## 2013-07-05 HISTORY — DX: Personal history of colon polyps, unspecified: Z86.0100

## 2013-07-05 NOTE — Telephone Encounter (Signed)
Left message on number given in admitting to return call if problems or questions. ewm

## 2013-07-12 ENCOUNTER — Encounter: Payer: Self-pay | Admitting: Internal Medicine

## 2013-07-12 NOTE — Progress Notes (Signed)
Quick Note:  2 diminutive adenomas - repeat colonoscopy 2019 ______ 

## 2014-05-06 ENCOUNTER — Other Ambulatory Visit: Payer: Self-pay | Admitting: Internal Medicine

## 2014-05-23 ENCOUNTER — Encounter: Payer: Self-pay | Admitting: Internal Medicine

## 2014-05-23 ENCOUNTER — Ambulatory Visit (INDEPENDENT_AMBULATORY_CARE_PROVIDER_SITE_OTHER): Payer: BC Managed Care – PPO | Admitting: Internal Medicine

## 2014-05-23 VITALS — BP 140/88 | HR 65 | Temp 98.5°F | Ht 70.0 in | Wt 147.0 lb

## 2014-05-23 DIAGNOSIS — F17200 Nicotine dependence, unspecified, uncomplicated: Secondary | ICD-10-CM | POA: Diagnosis not present

## 2014-05-23 DIAGNOSIS — Z0000 Encounter for general adult medical examination without abnormal findings: Secondary | ICD-10-CM

## 2014-05-23 DIAGNOSIS — I10 Essential (primary) hypertension: Secondary | ICD-10-CM

## 2014-05-23 DIAGNOSIS — F1020 Alcohol dependence, uncomplicated: Secondary | ICD-10-CM | POA: Diagnosis not present

## 2014-05-23 MED ORDER — VITAMIN D 1000 UNITS PO TABS: 1000.0000 [IU] | ORAL_TABLET | Freq: Every day | ORAL | Status: AC

## 2014-05-23 MED ORDER — TRIAMCINOLONE ACETONIDE 0.5 % EX CREA: 1.0000 "application " | TOPICAL_CREAM | Freq: Three times a day (TID) | CUTANEOUS | Status: DC

## 2014-05-23 NOTE — Assessment & Plan Note (Signed)
1 ppd Trying to use a vaper

## 2014-05-23 NOTE — Assessment & Plan Note (Signed)
Continue with current prescription therapy as reflected on the Med list.  

## 2014-05-23 NOTE — Assessment & Plan Note (Signed)
We discussed age appropriate health related issues, including available/recomended screening tests and vaccinations. We discussed a need for adhering to healthy diet and exercise. Labs were ordered. He declined EKG, CXR, gen and rectal exam, flue shot.  All questions were answered.

## 2014-05-23 NOTE — Progress Notes (Signed)
Subjective:   HPI  The patient is here for a wellness exam. The patient has been doing well overall without major physical or psychological issues going on lately.   The patient needs to address  chronic hypertension that has been well controlled with medicines  BP Readings from Last 3 Encounters:  05/23/14 140/88  07/04/13 122/69  05/21/13 158/92   Wt Readings from Last 3 Encounters:  05/23/14 147 lb (66.679 kg)  07/04/13 150 lb (68.04 kg)  06/03/13 150 lb (68.04 kg)     Review of Systems  Constitutional: Negative for appetite change, fatigue and unexpected weight change.  HENT: Positive for congestion. Negative for nosebleeds, sneezing, sore throat and trouble swallowing.   Eyes: Negative for itching and visual disturbance.  Respiratory: Positive for cough.   Cardiovascular: Negative for chest pain, palpitations and leg swelling.  Gastrointestinal: Negative for nausea, diarrhea, blood in stool and abdominal distention.  Genitourinary: Negative for frequency and hematuria.  Musculoskeletal: Negative for back pain, gait problem, joint swelling and neck pain.  Skin: Negative for rash.  Neurological: Negative for dizziness, tremors, speech difficulty and weakness.  Psychiatric/Behavioral: Negative for sleep disturbance, dysphoric mood and agitation. The patient is not nervous/anxious.        Objective:   Physical Exam  Constitutional: He is oriented to person, place, and time. He appears well-developed and well-nourished. No distress.  HENT:  Head: Normocephalic and atraumatic.  Right Ear: External ear normal.  Left Ear: External ear normal.  Nose: Nose normal.  Mouth/Throat: Oropharynx is clear and moist. No oropharyngeal exudate.  Eyes: Conjunctivae and EOM are normal. Pupils are equal, round, and reactive to light. Right eye exhibits no discharge. Left eye exhibits no discharge. No scleral icterus.  Neck: Normal range of motion. Neck supple. No JVD present. No  tracheal deviation present. No thyromegaly present.  Cardiovascular: Normal rate, regular rhythm, normal heart sounds and intact distal pulses.  Exam reveals no gallop and no friction rub.   No murmur heard. Pulmonary/Chest: Effort normal. No stridor. No respiratory distress. He has wheezes (B). He has no rales. He exhibits no tenderness.  Abdominal: Soft. Bowel sounds are normal. He exhibits no distension and no mass. There is no tenderness. There is no rebound and no guarding.  Genitourinary:  He declined rectal, gen exam  Musculoskeletal: Normal range of motion. He exhibits no edema and no tenderness.  Lymphadenopathy:    He has no cervical adenopathy.  Neurological: He is alert and oriented to person, place, and time. He has normal reflexes. No cranial nerve deficit. He exhibits normal muscle tone. Coordination normal.  Skin: Skin is warm and dry. No rash noted. He is not diaphoretic. No erythema. No pallor.  Psychiatric: He has a normal mood and affect. His behavior is normal. Judgment and thought content normal.    Lab Results  Component Value Date   WBC 5.9 05/15/2013   HGB 15.7 05/15/2013   HCT 44.8 05/15/2013   PLT 198.0 05/15/2013   GLUCOSE 100* 05/15/2013   CHOL 167 05/15/2013   TRIG 371.0* 05/15/2013   HDL 32.80* 05/15/2013   LDLDIRECT 104.1 05/15/2013   LDLCALC 113* 05/18/2012   ALT 14 05/15/2013   AST 17 05/15/2013   NA 133* 05/15/2013   K 4.4 05/15/2013   CL 100 05/15/2013   CREATININE 0.9 05/15/2013   BUN 14 05/15/2013   CO2 26 05/15/2013   TSH 1.13 05/15/2013   PSA 0.72 05/15/2013  Assessment & Plan:

## 2014-05-23 NOTE — Progress Notes (Signed)
Pre visit review using our clinic review tool, if applicable. No additional management support is needed unless otherwise documented below in the visit note. 

## 2014-05-23 NOTE — Assessment & Plan Note (Signed)
Chronic  4 beers/d

## 2014-05-25 ENCOUNTER — Encounter: Payer: Self-pay | Admitting: Internal Medicine

## 2014-05-26 ENCOUNTER — Telehealth: Payer: Self-pay | Admitting: Internal Medicine

## 2014-05-26 NOTE — Telephone Encounter (Signed)
emmi emailed °

## 2014-07-07 ENCOUNTER — Other Ambulatory Visit (INDEPENDENT_AMBULATORY_CARE_PROVIDER_SITE_OTHER): Payer: BC Managed Care – PPO

## 2014-07-07 DIAGNOSIS — Z0000 Encounter for general adult medical examination without abnormal findings: Secondary | ICD-10-CM

## 2014-07-07 LAB — BASIC METABOLIC PANEL
BUN: 9 mg/dL (ref 6–23)
CO2: 27 mEq/L (ref 19–32)
Calcium: 9.4 mg/dL (ref 8.4–10.5)
Chloride: 97 mEq/L (ref 96–112)
Creatinine, Ser: 0.8 mg/dL (ref 0.4–1.5)
GFR: 105.05 mL/min (ref >60.00)
Glucose, Bld: 104 mg/dL — ABNORMAL HIGH (ref 70–99)
POTASSIUM: 4.2 meq/L (ref 3.5–5.1)
SODIUM: 132 meq/L — AB (ref 135–145)

## 2014-07-07 LAB — HEPATIC FUNCTION PANEL
ALK PHOS: 51 U/L (ref 39–117)
ALT: 13 U/L (ref 0–53)
AST: 19 U/L (ref 0–37)
Albumin: 3.6 g/dL (ref 3.5–5.2)
Bilirubin, Direct: 0.1 mg/dL (ref 0.0–0.3)
Total Bilirubin: 0.4 mg/dL (ref 0.2–1.2)
Total Protein: 7.1 g/dL (ref 6.0–8.3)

## 2014-07-07 LAB — URINALYSIS
Bilirubin Urine: NEGATIVE
Hgb urine dipstick: NEGATIVE
Ketones, ur: NEGATIVE
Leukocytes, UA: NEGATIVE
NITRITE: NEGATIVE
TOTAL PROTEIN, URINE-UPE24: NEGATIVE
URINE GLUCOSE: NEGATIVE
Urobilinogen, UA: 0.2 (ref 0.0–1.0)
pH: 7 (ref 5.0–8.0)

## 2014-07-07 LAB — CBC WITH DIFFERENTIAL/PLATELET
Basophils Absolute: 0 10*3/uL (ref 0.0–0.1)
Basophils Relative: 0.4 % (ref 0.0–3.0)
Eosinophils Absolute: 0.1 10*3/uL (ref 0.0–0.7)
Eosinophils Relative: 2.4 % (ref 0.0–5.0)
HEMATOCRIT: 48.5 % (ref 39.0–52.0)
HEMOGLOBIN: 16.8 g/dL (ref 13.0–17.0)
LYMPHS ABS: 1.4 10*3/uL (ref 0.7–4.0)
Lymphocytes Relative: 31.1 % (ref 12.0–46.0)
MCHC: 34.6 g/dL (ref 30.0–36.0)
MCV: 98.6 fl (ref 78.0–100.0)
MONOS PCT: 7.6 % (ref 3.0–12.0)
Monocytes Absolute: 0.3 10*3/uL (ref 0.1–1.0)
NEUTROS ABS: 2.6 10*3/uL (ref 1.4–7.7)
Neutrophils Relative %: 58.5 % (ref 43.0–77.0)
PLATELETS: 244 10*3/uL (ref 150.0–400.0)
RBC: 4.92 Mil/uL (ref 4.22–5.81)
RDW: 12.3 % (ref 11.5–15.5)
WBC: 4.4 10*3/uL (ref 4.0–10.5)

## 2014-07-07 LAB — LIPID PANEL
Cholesterol: 173 mg/dL (ref 0–200)
HDL: 40.2 mg/dL (ref >39.00)
LDL CALC: 112 mg/dL — AB (ref 0–99)
NonHDL: 132.8
TRIGLYCERIDES: 104 mg/dL (ref 0.0–149.0)
Total CHOL/HDL Ratio: 4
VLDL: 20.8 mg/dL (ref 0.0–40.0)

## 2014-07-07 LAB — PSA: PSA: 0.82 ng/mL (ref 0.10–4.00)

## 2014-07-07 LAB — TSH: TSH: 1.86 u[IU]/mL (ref 0.35–4.50)

## 2014-07-09 ENCOUNTER — Encounter: Payer: Self-pay | Admitting: *Deleted

## 2015-04-07 ENCOUNTER — Telehealth: Payer: Self-pay | Admitting: Internal Medicine

## 2015-04-07 MED ORDER — LOSARTAN POTASSIUM-HCTZ 100-12.5 MG PO TABS: 1.0000 | ORAL_TABLET | Freq: Every day | ORAL | Status: DC

## 2015-04-07 MED ORDER — AMLODIPINE BESYLATE 5 MG PO TABS: 5.0000 mg | ORAL_TABLET | Freq: Every day | ORAL | Status: DC

## 2015-04-07 NOTE — Telephone Encounter (Signed)
Faxed refill to [facility]...Johny Chess

## 2015-04-07 NOTE — Telephone Encounter (Signed)
Fax#[phone] Phone#[phone] [facility] called requesting refills for amLODipine (NORVASC) 5 MG tablet [[id number] and losartan-hydrochlorothiazide (HYZAAR) 100-12.5 MG per tablet [[id number]]

## 2015-04-13 MED ORDER — LOSARTAN POTASSIUM-HCTZ 100-12.5 MG PO TABS
1.0000 | ORAL_TABLET | Freq: Every day | ORAL | Status: DC
Start: 2015-04-13 — End: 2015-07-27

## 2015-04-13 MED ORDER — AMLODIPINE BESYLATE 5 MG PO TABS: 5.0000 mg | ORAL_TABLET | Freq: Every day | ORAL | Status: DC

## 2015-04-13 NOTE — Addendum Note (Signed)
Addended by: Earnstine Regal on: 04/13/2015 10:59 AM   Modules accepted: Orders

## 2015-04-13 NOTE — Telephone Encounter (Signed)
Resent script to [facility]...Johny Chess

## 2015-04-13 NOTE — Telephone Encounter (Signed)
It looks like these prescriptions were sent [facility]. Can we resend these to [facility]

## 2015-07-27 ENCOUNTER — Telehealth: Payer: Self-pay | Admitting: *Deleted

## 2015-07-27 DIAGNOSIS — Z125 Encounter for screening for malignant neoplasm of prostate: Secondary | ICD-10-CM

## 2015-07-27 DIAGNOSIS — Z0000 Encounter for general adult medical examination without abnormal findings: Secondary | ICD-10-CM

## 2015-07-27 MED ORDER — LOSARTAN POTASSIUM-HCTZ 100-12.5 MG PO TABS: 1.0000 | ORAL_TABLET | Freq: Every day | ORAL | Status: DC

## 2015-07-27 MED ORDER — AMLODIPINE BESYLATE 5 MG PO TABS: 5.0000 mg | ORAL_TABLET | Freq: Every day | ORAL | Status: DC

## 2015-07-27 NOTE — Telephone Encounter (Signed)
Received call pt states her is needing refills sent to [facility] on his Bp medications. Inform pt per chart he is due for CPX can only send 30 day to local pharmacy until appt. Made cpx appt for 12/19, [facility].../lmb Entered CPX labs,,,/lmb

## 2015-08-14 ENCOUNTER — Other Ambulatory Visit (INDEPENDENT_AMBULATORY_CARE_PROVIDER_SITE_OTHER): Payer: Self-pay

## 2015-08-14 DIAGNOSIS — Z0189 Encounter for other specified special examinations: Secondary | ICD-10-CM

## 2015-08-14 DIAGNOSIS — Z125 Encounter for screening for malignant neoplasm of prostate: Secondary | ICD-10-CM

## 2015-08-14 DIAGNOSIS — Z0000 Encounter for general adult medical examination without abnormal findings: Secondary | ICD-10-CM

## 2015-08-14 LAB — LIPID PANEL
CHOL/HDL RATIO: 5
Cholesterol: 164 mg/dL (ref 0–200)
HDL: 35.3 mg/dL — ABNORMAL LOW (ref >39.00)
LDL CALC: 106 mg/dL — AB (ref 0–99)
NonHDL: 128.41
TRIGLYCERIDES: 110 mg/dL (ref 0.0–149.0)
VLDL: 22 mg/dL (ref 0.0–40.0)

## 2015-08-14 LAB — HEPATIC FUNCTION PANEL
ALBUMIN: 4.1 g/dL (ref 3.5–5.2)
ALK PHOS: 63 U/L (ref 39–117)
ALT: 13 U/L (ref 0–53)
AST: 16 U/L (ref 0–37)
Bilirubin, Direct: 0.1 mg/dL (ref 0.0–0.3)
Total Bilirubin: 0.8 mg/dL (ref 0.2–1.2)
Total Protein: 6.7 g/dL (ref 6.0–8.3)

## 2015-08-14 LAB — CBC WITH DIFFERENTIAL/PLATELET
BASOS PCT: 0.4 % (ref 0.0–3.0)
Basophils Absolute: 0 10*3/uL (ref 0.0–0.1)
EOS PCT: 4.2 % (ref 0.0–5.0)
Eosinophils Absolute: 0.2 10*3/uL (ref 0.0–0.7)
HEMATOCRIT: 48 % (ref 39.0–52.0)
HEMOGLOBIN: 16.3 g/dL (ref 13.0–17.0)
LYMPHS PCT: 43.3 % (ref 12.0–46.0)
Lymphs Abs: 2.2 10*3/uL (ref 0.7–4.0)
MCHC: 33.9 g/dL (ref 30.0–36.0)
MCV: 96.9 fl (ref 78.0–100.0)
MONOS PCT: 9 % (ref 3.0–12.0)
Monocytes Absolute: 0.5 10*3/uL (ref 0.1–1.0)
Neutro Abs: 2.2 10*3/uL (ref 1.4–7.7)
Neutrophils Relative %: 43.1 % (ref 43.0–77.0)
Platelets: 255 10*3/uL (ref 150.0–400.0)
RBC: 4.96 Mil/uL (ref 4.22–5.81)
RDW: 12.6 % (ref 11.5–15.5)
WBC: 5.1 10*3/uL (ref 4.0–10.5)

## 2015-08-14 LAB — BASIC METABOLIC PANEL
BUN: 11 mg/dL (ref 6–23)
CHLORIDE: 102 meq/L (ref 96–112)
CO2: 29 mEq/L (ref 19–32)
Calcium: 9.5 mg/dL (ref 8.4–10.5)
Creatinine, Ser: 0.89 mg/dL (ref 0.40–1.50)
GFR: 95.17 mL/min (ref >60.00)
Glucose, Bld: 91 mg/dL (ref 70–99)
POTASSIUM: 4.7 meq/L (ref 3.5–5.1)
SODIUM: 136 meq/L (ref 135–145)

## 2015-08-14 LAB — URINALYSIS, ROUTINE W REFLEX MICROSCOPIC
Bilirubin Urine: NEGATIVE
Hgb urine dipstick: NEGATIVE
Ketones, ur: NEGATIVE
LEUKOCYTES UA: NEGATIVE
Nitrite: NEGATIVE
PH: 7 (ref 5.0–8.0)
RBC / HPF: NONE SEEN (ref 0–?)
SPECIFIC GRAVITY, URINE: 1.01 (ref 1.000–1.030)
TOTAL PROTEIN, URINE-UPE24: NEGATIVE
Urine Glucose: NEGATIVE
Urobilinogen, UA: 0.2 (ref 0.0–1.0)
WBC, UA: NONE SEEN (ref 0–?)

## 2015-08-14 LAB — PSA: PSA: 0.71 ng/mL (ref 0.10–4.00)

## 2015-08-14 LAB — TSH: TSH: 2.23 u[IU]/mL (ref 0.35–4.50)

## 2015-08-17 ENCOUNTER — Ambulatory Visit (INDEPENDENT_AMBULATORY_CARE_PROVIDER_SITE_OTHER): Payer: BLUE CROSS/BLUE SHIELD | Admitting: Internal Medicine

## 2015-08-17 ENCOUNTER — Encounter: Payer: Self-pay | Admitting: Internal Medicine

## 2015-08-17 VITALS — BP 118/70 | HR 69 | Temp 97.4°F | Ht 70.0 in | Wt 145.8 lb

## 2015-08-17 DIAGNOSIS — G4700 Insomnia, unspecified: Secondary | ICD-10-CM

## 2015-08-17 DIAGNOSIS — Z0000 Encounter for general adult medical examination without abnormal findings: Secondary | ICD-10-CM | POA: Diagnosis not present

## 2015-08-17 DIAGNOSIS — I10 Essential (primary) hypertension: Secondary | ICD-10-CM

## 2015-08-17 DIAGNOSIS — K219 Gastro-esophageal reflux disease without esophagitis: Secondary | ICD-10-CM

## 2015-08-17 DIAGNOSIS — F17200 Nicotine dependence, unspecified, uncomplicated: Secondary | ICD-10-CM | POA: Diagnosis not present

## 2015-08-17 MED ORDER — AMLODIPINE BESYLATE 5 MG PO TABS: 5.0000 mg | ORAL_TABLET | Freq: Every day | ORAL | Status: DC

## 2015-08-17 MED ORDER — LOSARTAN POTASSIUM-HCTZ 100-12.5 MG PO TABS: 1.0000 | ORAL_TABLET | Freq: Every day | ORAL | Status: DC

## 2015-08-17 NOTE — Progress Notes (Signed)
Subjective:  Patient ID: Kyle Stafford, male    DOB: 1961/05/12  Age: 54 y.o. MRN: [id number]  CC: No chief complaint on file.   HPI Kyle Stafford presents for a well exam  Outpatient Prescriptions Prior to Visit  Medication Sig Dispense Refill  . aspirin (BAYER ASPIRIN) 325 MG tablet Take 1 tablet (325 mg total) by mouth daily. 100 tablet 3  . amLODipine (NORVASC) 5 MG tablet Take 1 tablet (5 mg total) by mouth daily. 30 tablet 0  . losartan-hydrochlorothiazide (HYZAAR) 100-12.5 MG tablet Take 1 tablet by mouth daily. 30 tablet 0  . triamcinolone cream (KENALOG) 0.5 % Apply 1 application topically 3 (three) times daily. 60 g 0   No facility-administered medications prior to visit.    ROS Review of Systems  Constitutional: Negative for appetite change, fatigue and unexpected weight change.  HENT: Negative for congestion, nosebleeds, sneezing, sore throat and trouble swallowing.   Eyes: Negative for itching and visual disturbance.  Respiratory: Negative for cough.   Cardiovascular: Negative for chest pain, palpitations and leg swelling.  Gastrointestinal: Negative for nausea, diarrhea, blood in stool and abdominal distention.  Genitourinary: Negative for frequency and hematuria.  Musculoskeletal: Negative for back pain, joint swelling, gait problem and neck pain.  Skin: Negative for rash.  Neurological: Negative for dizziness, tremors, speech difficulty and weakness.  Psychiatric/Behavioral: Negative for suicidal ideas, sleep disturbance, dysphoric mood and agitation. The patient is not nervous/anxious.     Objective:  BP 118/70 mmHg  Pulse 69  Temp(Src) 97.4 F (36.3 C) (Oral)  Ht 5\' 10"  (1.778 m)  Wt 145 lb 12 oz (66.112 kg)  BMI 20.91 kg/m2  SpO2 99%  BP Readings from Last 3 Encounters:  08/17/15 118/70  05/23/14 140/88  07/04/13 122/69    Wt Readings from Last 3 Encounters:  08/17/15 145 lb 12 oz (66.112 kg)  05/23/14 147 lb (66.679 kg)  07/04/13 150 lb  (68.04 kg)    Physical Exam  Constitutional: He is oriented to person, place, and time. He appears well-developed and well-nourished. No distress.  HENT:  Head: Normocephalic and atraumatic.  Right Ear: External ear normal.  Left Ear: External ear normal.  Nose: Nose normal.  Mouth/Throat: Oropharynx is clear and moist. No oropharyngeal exudate.  Eyes: Conjunctivae and EOM are normal. Pupils are equal, round, and reactive to light. Right eye exhibits no discharge. Left eye exhibits no discharge. No scleral icterus.  Neck: Normal range of motion. Neck supple. No JVD present. No tracheal deviation present. No thyromegaly present.  Cardiovascular: Normal rate, regular rhythm, normal heart sounds and intact distal pulses.  Exam reveals no gallop and no friction rub.   No murmur heard. Pulmonary/Chest: Effort normal and breath sounds normal. No stridor. No respiratory distress. He has no wheezes. He has no rales. He exhibits no tenderness.  Abdominal: Soft. Bowel sounds are normal. He exhibits no distension and no mass. There is no tenderness. There is no rebound and no guarding.  Genitourinary: Rectum normal, prostate normal and penis normal. Guaiac negative stool. No penile tenderness.  Musculoskeletal: Normal range of motion. He exhibits no edema or tenderness.  Lymphadenopathy:    He has no cervical adenopathy.  Neurological: He is alert and oriented to person, place, and time. He has normal reflexes. No cranial nerve deficit. He exhibits normal muscle tone. Coordination normal.  Skin: Skin is warm and dry. No rash noted. He is not diaphoretic. No erythema. No pallor.  Psychiatric: He has a normal  mood and affect. His behavior is normal. Judgment and thought content normal.    Lab Results  Component Value Date   WBC 5.1 08/14/2015   HGB 16.3 08/14/2015   HCT 48.0 08/14/2015   PLT 255.0 08/14/2015   GLUCOSE 91 08/14/2015   CHOL 164 08/14/2015   TRIG 110.0 08/14/2015   HDL 35.30*  08/14/2015   LDLDIRECT 104.1 05/15/2013   LDLCALC 106* 08/14/2015   ALT 13 08/14/2015   AST 16 08/14/2015   NA 136 08/14/2015   K 4.7 08/14/2015   CL 102 08/14/2015   CREATININE 0.89 08/14/2015   BUN 11 08/14/2015   CO2 29 08/14/2015   TSH 2.23 08/14/2015   PSA 0.71 08/14/2015    No results found.  Assessment & Plan:   There are no diagnoses linked to this encounter. I have discontinued Kyle Stafford's triamcinolone cream. I am also having him maintain his aspirin, losartan-hydrochlorothiazide, and amLODipine.  Meds ordered this encounter  Medications  . losartan-hydrochlorothiazide (HYZAAR) 100-12.5 MG tablet    Sig: Take 1 tablet by mouth daily.    Dispense:  90 tablet    Refill:  3  . amLODipine (NORVASC) 5 MG tablet    Sig: Take 1 tablet (5 mg total) by mouth daily.    Dispense:  90 tablet    Refill:  3     Follow-up: No Follow-up on file.  Walker Kehr, MD

## 2015-08-17 NOTE — Assessment & Plan Note (Signed)
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

## 2015-08-17 NOTE — Assessment & Plan Note (Signed)
We discussed age appropriate health related issues, including available/recomended screening tests and vaccinations. We discussed a need for adhering to healthy diet and exercise. Labs were ordered. He declined EKG, CXR, gen and rectal exam, flue shot.  All questions were answered. 

## 2015-08-17 NOTE — Assessment & Plan Note (Signed)
-   doing well

## 2015-08-17 NOTE — Assessment & Plan Note (Signed)
On Amlodipine, Losartan HCT 

## 2015-08-17 NOTE — Progress Notes (Signed)
Pre visit review using our clinic review tool, if applicable. No additional management support is needed unless otherwise documented below in the visit note. 

## 2015-09-19 ENCOUNTER — Other Ambulatory Visit: Payer: Self-pay | Admitting: Internal Medicine

## 2016-08-16 ENCOUNTER — Other Ambulatory Visit (INDEPENDENT_AMBULATORY_CARE_PROVIDER_SITE_OTHER): Payer: 59

## 2016-08-16 ENCOUNTER — Telehealth: Payer: Self-pay | Admitting: *Deleted

## 2016-08-16 DIAGNOSIS — I10 Essential (primary) hypertension: Secondary | ICD-10-CM | POA: Diagnosis not present

## 2016-08-16 DIAGNOSIS — Z0000 Encounter for general adult medical examination without abnormal findings: Secondary | ICD-10-CM

## 2016-08-16 LAB — BASIC METABOLIC PANEL
BUN: 6 mg/dL (ref 6–23)
CALCIUM: 9.6 mg/dL (ref 8.4–10.5)
CO2: 30 meq/L (ref 19–32)
Chloride: 97 mEq/L (ref 96–112)
Creatinine, Ser: 0.79 mg/dL (ref 0.40–1.50)
GFR: 108.78 mL/min (ref >60.00)
GLUCOSE: 103 mg/dL — AB (ref 70–99)
POTASSIUM: 4.5 meq/L (ref 3.5–5.1)
Sodium: 134 mEq/L — ABNORMAL LOW (ref 135–145)

## 2016-08-16 LAB — HEPATIC FUNCTION PANEL
ALBUMIN: 4.6 g/dL (ref 3.5–5.2)
ALK PHOS: 54 U/L (ref 39–117)
ALT: 13 U/L (ref 0–53)
AST: 17 U/L (ref 0–37)
Bilirubin, Direct: 0.1 mg/dL (ref 0.0–0.3)
TOTAL PROTEIN: 6.9 g/dL (ref 6.0–8.3)
Total Bilirubin: 0.6 mg/dL (ref 0.2–1.2)

## 2016-08-16 LAB — URINALYSIS, ROUTINE W REFLEX MICROSCOPIC
Bilirubin Urine: NEGATIVE
Hgb urine dipstick: NEGATIVE
Ketones, ur: NEGATIVE
Leukocytes, UA: NEGATIVE
Nitrite: NEGATIVE
PH: 7 (ref 5.0–8.0)
RBC / HPF: NONE SEEN (ref 0–?)
TOTAL PROTEIN, URINE-UPE24: NEGATIVE
URINE GLUCOSE: NEGATIVE
Urobilinogen, UA: 0.2 (ref 0.0–1.0)
WBC, UA: NONE SEEN (ref 0–?)

## 2016-08-16 LAB — CBC WITH DIFFERENTIAL/PLATELET
Basophils Absolute: 0 10*3/uL (ref 0.0–0.1)
Basophils Relative: 0.3 % (ref 0.0–3.0)
EOS ABS: 0.1 10*3/uL (ref 0.0–0.7)
Eosinophils Relative: 2.4 % (ref 0.0–5.0)
HCT: 47.1 % (ref 39.0–52.0)
HEMOGLOBIN: 16.3 g/dL (ref 13.0–17.0)
LYMPHS ABS: 1.5 10*3/uL (ref 0.7–4.0)
Lymphocytes Relative: 37.3 % (ref 12.0–46.0)
MCHC: 34.5 g/dL (ref 30.0–36.0)
MCV: 98.7 fl (ref 78.0–100.0)
MONO ABS: 0.4 10*3/uL (ref 0.1–1.0)
Monocytes Relative: 8.9 % (ref 3.0–12.0)
NEUTROS PCT: 51.1 % (ref 43.0–77.0)
Neutro Abs: 2.1 10*3/uL (ref 1.4–7.7)
Platelets: 227 10*3/uL (ref 150.0–400.0)
RBC: 4.77 Mil/uL (ref 4.22–5.81)
RDW: 12.8 % (ref 11.5–15.5)
WBC: 4.1 10*3/uL (ref 4.0–10.5)

## 2016-08-16 LAB — LIPID PANEL
CHOLESTEROL: 159 mg/dL (ref 0–200)
HDL: 45.5 mg/dL (ref >39.00)
LDL Cholesterol: 88 mg/dL (ref 0–99)
NonHDL: 113.81
TRIGLYCERIDES: 127 mg/dL (ref 0.0–149.0)
Total CHOL/HDL Ratio: 4
VLDL: 25.4 mg/dL (ref 0.0–40.0)

## 2016-08-16 LAB — PSA: PSA: 0.89 ng/mL (ref 0.10–4.00)

## 2016-08-16 LAB — TSH: TSH: 1.59 u[IU]/mL (ref 0.35–4.50)

## 2016-08-16 NOTE — Telephone Encounter (Signed)
Pt walked in to have his CPE labs done. He is requesting orders be placed. Done. See labs.

## 2016-08-17 ENCOUNTER — Ambulatory Visit (INDEPENDENT_AMBULATORY_CARE_PROVIDER_SITE_OTHER): Payer: 59 | Admitting: Internal Medicine

## 2016-08-17 ENCOUNTER — Encounter: Payer: Self-pay | Admitting: Internal Medicine

## 2016-08-17 DIAGNOSIS — G4700 Insomnia, unspecified: Secondary | ICD-10-CM

## 2016-08-17 DIAGNOSIS — F1020 Alcohol dependence, uncomplicated: Secondary | ICD-10-CM

## 2016-08-17 DIAGNOSIS — Z0000 Encounter for general adult medical examination without abnormal findings: Secondary | ICD-10-CM | POA: Diagnosis not present

## 2016-08-17 DIAGNOSIS — Z86010 Personal history of colonic polyps: Secondary | ICD-10-CM

## 2016-08-17 DIAGNOSIS — F17200 Nicotine dependence, unspecified, uncomplicated: Secondary | ICD-10-CM

## 2016-08-17 DIAGNOSIS — I10 Essential (primary) hypertension: Secondary | ICD-10-CM | POA: Diagnosis not present

## 2016-08-17 MED ORDER — LOSARTAN POTASSIUM-HCTZ 100-12.5 MG PO TABS: 1.0000 | ORAL_TABLET | Freq: Every day | ORAL | 3 refills | Status: DC

## 2016-08-17 MED ORDER — AMLODIPINE BESYLATE 5 MG PO TABS: 5.0000 mg | ORAL_TABLET | Freq: Every day | ORAL | 3 refills | Status: DC

## 2016-08-17 NOTE — Progress Notes (Signed)
Subjective:  Patient ID: Kyle Stafford, male    DOB: Jul 19, 1961  Age: 55 y.o. MRN: [id number]  CC: Annual Exam   HPI Kyle Stafford presents for a well exam  Outpatient Medications Prior to Visit  Medication Sig Dispense Refill  . amLODipine (NORVASC) 5 MG tablet Take 1 tablet (5 mg total) by mouth daily. 90 tablet 3  . losartan-hydrochlorothiazide (HYZAAR) 100-12.5 MG tablet Take 1 tablet by mouth daily. 90 tablet 3  . amLODipine (NORVASC) 5 MG tablet TAKE 1 TABLET (5 MG TOTAL) BY MOUTH DAILY. 30 tablet 5  . aspirin (BAYER ASPIRIN) 325 MG tablet Take 1 tablet (325 mg total) by mouth daily. 100 tablet 3  . losartan-hydrochlorothiazide (HYZAAR) 100-12.5 MG tablet TAKE 1 TABLET BY MOUTH DAILY. 30 tablet 5   No facility-administered medications prior to visit.     ROS Review of Systems  Constitutional: Negative for appetite change, fatigue and unexpected weight change.  HENT: Negative for congestion, nosebleeds, sneezing, sore throat and trouble swallowing.   Eyes: Negative for itching and visual disturbance.  Respiratory: Negative for cough.   Cardiovascular: Negative for chest pain, palpitations and leg swelling.  Gastrointestinal: Negative for abdominal distention, blood in stool, diarrhea and nausea.  Genitourinary: Negative for frequency and hematuria.  Musculoskeletal: Negative for back pain, gait problem, joint swelling and neck pain.  Skin: Negative for rash.  Neurological: Negative for dizziness, tremors, speech difficulty and weakness.  Psychiatric/Behavioral: Negative for agitation, dysphoric mood and sleep disturbance. The patient is not nervous/anxious.     Objective:  BP 128/80   Pulse 65   Ht 5\' 10"  (1.778 m)   Wt 151 lb (68.5 kg)   SpO2 97%   BMI 21.67 kg/m   BP Readings from Last 3 Encounters:  08/17/16 128/80  08/17/15 118/70  05/23/14 140/88    Wt Readings from Last 3 Encounters:  08/17/16 151 lb (68.5 kg)  08/17/15 145 lb 12 oz (66.1 kg)    05/23/14 147 lb (66.7 kg)    Physical Exam  Constitutional: He is oriented to person, place, and time. He appears well-developed and well-nourished. No distress.  HENT:  Head: Normocephalic and atraumatic.  Right Ear: External ear normal.  Left Ear: External ear normal.  Nose: Nose normal.  Mouth/Throat: Oropharynx is clear and moist. No oropharyngeal exudate.  Eyes: Conjunctivae and EOM are normal. Pupils are equal, round, and reactive to light. Right eye exhibits no discharge. Left eye exhibits no discharge. No scleral icterus.  Neck: Normal range of motion. Neck supple. No JVD present. No tracheal deviation present. No thyromegaly present.  Cardiovascular: Normal rate, regular rhythm, normal heart sounds and intact distal pulses.  Exam reveals no gallop and no friction rub.   No murmur heard. Pulmonary/Chest: Effort normal and breath sounds normal. No stridor. No respiratory distress. He has no wheezes. He has no rales. He exhibits no tenderness.  Abdominal: Soft. Bowel sounds are normal. He exhibits no distension and no mass. There is no tenderness. There is no rebound and no guarding.  Genitourinary: Rectum normal, prostate normal and penis normal. Rectal exam shows guaiac negative stool. No penile tenderness.  Musculoskeletal: Normal range of motion. He exhibits no edema or tenderness.  Lymphadenopathy:    He has no cervical adenopathy.  Neurological: He is alert and oriented to person, place, and time. He has normal reflexes. No cranial nerve deficit. He exhibits normal muscle tone. Coordination normal.  Skin: Skin is warm and dry. No rash noted. He  is not diaphoretic. No erythema. No pallor.  Psychiatric: He has a normal mood and affect. His behavior is normal. Judgment and thought content normal.  B rhonchi  Lab Results  Component Value Date   WBC 4.1 08/16/2016   HGB 16.3 08/16/2016   HCT 47.1 08/16/2016   PLT 227.0 08/16/2016   GLUCOSE 103 (H) 08/16/2016   CHOL 159  08/16/2016   TRIG 127.0 08/16/2016   HDL 45.50 08/16/2016   LDLDIRECT 104.1 05/15/2013   LDLCALC 88 08/16/2016   ALT 13 08/16/2016   AST 17 08/16/2016   NA 134 (L) 08/16/2016   K 4.5 08/16/2016   CL 97 08/16/2016   CREATININE 0.79 08/16/2016   BUN 6 08/16/2016   CO2 30 08/16/2016   TSH 1.59 08/16/2016   PSA 0.89 08/16/2016    No results found.  Assessment & Plan:   There are no diagnoses linked to this encounter. I have discontinued Mr. Labine's aspirin. I am also having him maintain his losartan-hydrochlorothiazide, amLODipine, and aspirin EC.  Meds ordered this encounter  Medications  . aspirin EC 81 MG tablet    Sig: Take 81 mg by mouth daily.     Follow-up: No Follow-up on file.  Walker Kehr, MD

## 2016-08-17 NOTE — Assessment & Plan Note (Signed)
1.5 PPD - discussed: needs to quit

## 2016-08-17 NOTE — Assessment & Plan Note (Signed)
We discussed age appropriate health related issues, including available/recomended screening tests and vaccinations. We discussed a need for adhering to healthy diet and exercise. Labs were ordered. He declined EKG, CXR, gen and rectal exam, flue shot.  All questions were answered.

## 2016-08-17 NOTE — Progress Notes (Signed)
Pre visit review using our clinic review tool, if applicable. No additional management support is needed unless otherwise documented below in the visit note. 

## 2016-08-17 NOTE — Assessment & Plan Note (Signed)
Shift work

## 2016-08-17 NOTE — Assessment & Plan Note (Signed)
Colon due in 2019

## 2016-08-17 NOTE — Assessment & Plan Note (Signed)
Amlodipine, Hyzaar

## 2016-08-17 NOTE — Assessment & Plan Note (Signed)
Discussed.

## 2017-07-24 ENCOUNTER — Other Ambulatory Visit: Payer: Self-pay

## 2017-07-24 MED ORDER — LOSARTAN POTASSIUM-HCTZ 100-12.5 MG PO TABS: 1.0000 | ORAL_TABLET | Freq: Every day | ORAL | 0 refills | Status: DC

## 2017-07-24 MED ORDER — AMLODIPINE BESYLATE 5 MG PO TABS: 5.0000 mg | ORAL_TABLET | Freq: Every day | ORAL | 0 refills | Status: DC

## 2017-08-17 ENCOUNTER — Encounter: Payer: 59 | Admitting: Internal Medicine

## 2017-09-19 ENCOUNTER — Encounter: Payer: Self-pay | Admitting: Internal Medicine

## 2017-09-19 ENCOUNTER — Other Ambulatory Visit (INDEPENDENT_AMBULATORY_CARE_PROVIDER_SITE_OTHER): Payer: BLUE CROSS/BLUE SHIELD

## 2017-09-19 ENCOUNTER — Ambulatory Visit (INDEPENDENT_AMBULATORY_CARE_PROVIDER_SITE_OTHER): Payer: BLUE CROSS/BLUE SHIELD | Admitting: Internal Medicine

## 2017-09-19 ENCOUNTER — Other Ambulatory Visit: Payer: 59

## 2017-09-19 VITALS — BP 130/70 | HR 63 | Temp 98.2°F | Wt 152.1 lb

## 2017-09-19 DIAGNOSIS — Z0001 Encounter for general adult medical examination with abnormal findings: Secondary | ICD-10-CM

## 2017-09-19 DIAGNOSIS — I10 Essential (primary) hypertension: Secondary | ICD-10-CM

## 2017-09-19 DIAGNOSIS — R062 Wheezing: Secondary | ICD-10-CM | POA: Diagnosis not present

## 2017-09-19 DIAGNOSIS — Z0000 Encounter for general adult medical examination without abnormal findings: Secondary | ICD-10-CM

## 2017-09-19 DIAGNOSIS — R29898 Other symptoms and signs involving the musculoskeletal system: Secondary | ICD-10-CM | POA: Insufficient documentation

## 2017-09-19 LAB — TSH: TSH: 1.88 u[IU]/mL (ref 0.35–4.50)

## 2017-09-19 LAB — BASIC METABOLIC PANEL
BUN: 10 mg/dL (ref 6–23)
CALCIUM: 10.1 mg/dL (ref 8.4–10.5)
CO2: 32 mEq/L (ref 19–32)
Chloride: 102 mEq/L (ref 96–112)
Creatinine, Ser: 0.77 mg/dL (ref 0.40–1.50)
GFR: 111.59 mL/min (ref >60.00)
Glucose, Bld: 103 mg/dL — ABNORMAL HIGH (ref 70–99)
POTASSIUM: 4.5 meq/L (ref 3.5–5.1)
SODIUM: 138 meq/L (ref 135–145)

## 2017-09-19 LAB — LIPID PANEL
CHOLESTEROL: 179 mg/dL (ref 0–200)
HDL: 36.3 mg/dL — ABNORMAL LOW (ref >39.00)
LDL Cholesterol: 105 mg/dL — ABNORMAL HIGH (ref 0–99)
NONHDL: 142.29
Total CHOL/HDL Ratio: 5
Triglycerides: 186 mg/dL — ABNORMAL HIGH (ref 0.0–149.0)
VLDL: 37.2 mg/dL (ref 0.0–40.0)

## 2017-09-19 LAB — HEPATIC FUNCTION PANEL
ALT: 18 U/L (ref 0–53)
AST: 17 U/L (ref 0–37)
Albumin: 4.4 g/dL (ref 3.5–5.2)
Alkaline Phosphatase: 68 U/L (ref 39–117)
BILIRUBIN DIRECT: 0.1 mg/dL (ref 0.0–0.3)
Total Bilirubin: 0.5 mg/dL (ref 0.2–1.2)
Total Protein: 7.4 g/dL (ref 6.0–8.3)

## 2017-09-19 LAB — CBC WITH DIFFERENTIAL/PLATELET
BASOS PCT: 0.5 % (ref 0.0–3.0)
Basophils Absolute: 0 10*3/uL (ref 0.0–0.1)
EOS PCT: 2.5 % (ref 0.0–5.0)
Eosinophils Absolute: 0.1 10*3/uL (ref 0.0–0.7)
HEMATOCRIT: 46 % (ref 39.0–52.0)
HEMOGLOBIN: 15.9 g/dL (ref 13.0–17.0)
Lymphocytes Relative: 33.8 % (ref 12.0–46.0)
Lymphs Abs: 1.8 10*3/uL (ref 0.7–4.0)
MCHC: 34.7 g/dL (ref 30.0–36.0)
MCV: 97.6 fl (ref 78.0–100.0)
MONO ABS: 0.5 10*3/uL (ref 0.1–1.0)
MONOS PCT: 9.1 % (ref 3.0–12.0)
Neutro Abs: 2.8 10*3/uL (ref 1.4–7.7)
Neutrophils Relative %: 54.1 % (ref 43.0–77.0)
Platelets: 234 10*3/uL (ref 150.0–400.0)
RBC: 4.71 Mil/uL (ref 4.22–5.81)
RDW: 12.1 % (ref 11.5–15.5)
WBC: 5.3 10*3/uL (ref 4.0–10.5)

## 2017-09-19 MED ORDER — AMLODIPINE BESYLATE 5 MG PO TABS: 5.0000 mg | ORAL_TABLET | Freq: Every day | ORAL | 3 refills | Status: DC

## 2017-09-19 MED ORDER — VITAMIN D3 50 MCG (2000 UT) PO CAPS: 2000.0000 [IU] | ORAL_CAPSULE | Freq: Every day | ORAL | 3 refills | Status: AC

## 2017-09-19 MED ORDER — LOSARTAN POTASSIUM-HCTZ 100-12.5 MG PO TABS: 1.0000 | ORAL_TABLET | Freq: Every day | ORAL | 3 refills | Status: DC

## 2017-09-19 MED ORDER — B COMPLEX PO TABS: 1.0000 | ORAL_TABLET | Freq: Every day | ORAL | 3 refills | Status: AC

## 2017-09-19 NOTE — Progress Notes (Signed)
Subjective:  Patient ID: Kyle Stafford, male    DOB: 14-Nov-1960  Age: 57 y.o. MRN: [id number]  CC: No chief complaint on file.   HPI Kyle Stafford presents for well exam C/o R shoulder pain x long time: unable to do pull ups, push ups, weak...  Outpatient Medications Prior to Visit  Medication Sig Dispense Refill  . amLODipine (NORVASC) 5 MG tablet Take 1 tablet (5 mg total) by mouth daily. 90 tablet 0  . aspirin EC 81 MG tablet Take 81 mg by mouth daily.    Marland Kitchen losartan-hydrochlorothiazide (HYZAAR) 100-12.5 MG tablet Take 1 tablet by mouth daily. 90 tablet 0   No facility-administered medications prior to visit.     ROS Review of Systems  Constitutional: Negative for appetite change, fatigue and unexpected weight change.  HENT: Negative for congestion, nosebleeds, sneezing, sore throat and trouble swallowing.   Eyes: Negative for itching and visual disturbance.  Respiratory: Positive for cough and wheezing.   Cardiovascular: Negative for chest pain, palpitations and leg swelling.  Gastrointestinal: Negative for abdominal distention, blood in stool, diarrhea and nausea.  Genitourinary: Negative for frequency and hematuria.  Musculoskeletal: Positive for arthralgias. Negative for back pain, gait problem, joint swelling and neck pain.  Skin: Negative for rash.  Neurological: Positive for weakness. Negative for dizziness, tremors and speech difficulty.  Psychiatric/Behavioral: Negative for agitation, dysphoric mood and sleep disturbance. The patient is not nervous/anxious.     Objective:  BP 130/70   Pulse 63   Temp 98.2 F (36.8 C) (Oral)   Wt 152 lb 1.3 oz (69 kg)   SpO2 95%   BMI 21.82 kg/m   BP Readings from Last 3 Encounters:  09/19/17 130/70  08/17/16 128/80  08/17/15 118/70    Wt Readings from Last 3 Encounters:  09/19/17 152 lb 1.3 oz (69 kg)  08/17/16 151 lb (68.5 kg)  08/17/15 145 lb 12 oz (66.1 kg)    Physical Exam  Constitutional: He is oriented  to person, place, and time. He appears well-developed and well-nourished. No distress.  HENT:  Head: Normocephalic and atraumatic.  Right Ear: External ear normal.  Left Ear: External ear normal.  Nose: Nose normal.  Mouth/Throat: Oropharynx is clear and moist. No oropharyngeal exudate.  Eyes: Conjunctivae and EOM are normal. Pupils are equal, round, and reactive to light. Right eye exhibits no discharge. Left eye exhibits no discharge. No scleral icterus.  Neck: Normal range of motion. Neck supple. No JVD present. No tracheal deviation present. No thyromegaly present.  Cardiovascular: Normal rate, regular rhythm, normal heart sounds and intact distal pulses. Exam reveals no gallop and no friction rub.  No murmur heard. Pulmonary/Chest: Effort normal. No stridor. No respiratory distress. He has wheezes. He has no rales. He exhibits no tenderness.  Abdominal: Soft. Bowel sounds are normal. He exhibits no distension and no mass. There is no tenderness. There is no rebound and no guarding.  Musculoskeletal: Normal range of motion. He exhibits no edema or tenderness.  Lymphadenopathy:    He has no cervical adenopathy.  Neurological: He is alert and oriented to person, place, and time. He has normal reflexes. No cranial nerve deficit. He exhibits normal muscle tone. Coordination normal.  Skin: Skin is warm and dry. No rash noted. He is not diaphoretic. No erythema. No pallor.  Psychiatric: He has a normal mood and affect. His behavior is normal. Judgment and thought content normal.  R shoulder - weak abductors L AC separated Pt refused rectal  Lab Results  Component Value Date   WBC 4.1 08/16/2016   HGB 16.3 08/16/2016   HCT 47.1 08/16/2016   PLT 227.0 08/16/2016   GLUCOSE 103 (H) 08/16/2016   CHOL 159 08/16/2016   TRIG 127.0 08/16/2016   HDL 45.50 08/16/2016   LDLDIRECT 104.1 05/15/2013   LDLCALC 88 08/16/2016   ALT 13 08/16/2016   AST 17 08/16/2016   NA 134 (L) 08/16/2016   K 4.5  08/16/2016   CL 97 08/16/2016   CREATININE 0.79 08/16/2016   BUN 6 08/16/2016   CO2 30 08/16/2016   TSH 1.59 08/16/2016   PSA 0.89 08/16/2016    No results found.  Assessment & Plan:   There are no diagnoses linked to this encounter. I am having Kyle Stafford maintain his aspirin EC, losartan-hydrochlorothiazide, and amLODipine.  No orders of the defined types were placed in this encounter.    Follow-up: No Follow-up on file.  Walker Kehr, MD

## 2017-09-19 NOTE — Assessment & Plan Note (Signed)
?  rot cuff tear - Sports Med ref

## 2017-09-19 NOTE — Assessment & Plan Note (Signed)
Amlodipine, Hyzaar Labs

## 2017-09-19 NOTE — Assessment & Plan Note (Addendum)
Pt refused CXR Smoking discussed

## 2017-09-19 NOTE — Assessment & Plan Note (Signed)
We discussed age appropriate health related issues, including available/recomended screening tests and vaccinations. We discussed a need for adhering to healthy diet and exercise. Labs were ordered. He declined EKG, CXR, gen and rectal exam, flue shot.  All questions were answered. Colonoscopy 2014 - Dr Carlean Purl Pt declined a rectal exam Declined shots

## 2017-09-26 ENCOUNTER — Ambulatory Visit: Payer: BLUE CROSS/BLUE SHIELD | Admitting: Family Medicine

## 2017-09-26 ENCOUNTER — Encounter: Payer: Self-pay | Admitting: Family Medicine

## 2017-09-26 DIAGNOSIS — R29898 Other symptoms and signs involving the musculoskeletal system: Secondary | ICD-10-CM

## 2017-09-26 NOTE — Assessment & Plan Note (Addendum)
He seems to notice his pain with the throwing motion and it feels deep. Possible that he has capsular tightness. Rotator cuff feels strong and no defects on [place]. Doesn't appear to be polymyalgia rheumatica. No signs of atrophy on inspection so not likely nerve compression. Possible some scapular dyskinesis attributing.  - GH injection today  - counseled on HEP  - theraband  - discussed PT. Could try NSAID. If no improvement then consider xray.

## 2017-09-26 NOTE — Patient Instructions (Signed)
Please try the range of motion exercises.  Please try to take it easy for the next 2 days.  Please follow up with me in 4-6 weeks if there is no improvement.

## 2017-09-26 NOTE — Progress Notes (Signed)
Kyle Stafford - 57 y.o. male MRN [id number]  Date of birth: 05-30-1961  SUBJECTIVE:  Including CC & ROS.  Chief Complaint  Patient presents with  . Bilateral shoulder pain    Kyle Stafford is a 57 y.o. male that is presenting with bilateral shoulder weakness/pain. Pain has been chronic, increasing over the past month. 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 Certain movements trigger severe pain. Pain is located diffusely throughout his shoulder, more posterior aspect. The pain seems to be deep in his shoulder. He wants to be able to throw a baseball without pain. He has to throw regularly and then it will loosen up his shoulder to where he can throw. He notices the pain in the posterior capsule during the wind up and then has significant pain on the follow through. He denies any radicular symptoms. He has tried anything for the pain. He doesn't notice the pain outside of the throwing motion. He was adopted. Denies any pelvic weakness.    Review of Systems  Constitutional: Negative for fever.  HENT: Negative for ear pain.   Respiratory: Negative for cough.   Cardiovascular: Negative for chest pain.  Gastrointestinal: Negative for abdominal pain.  Musculoskeletal: Positive for back pain.  Skin: Negative for color change.  Neurological: Positive for weakness.  Hematological: Negative for adenopathy.  Psychiatric/Behavioral: Negative for agitation.    HISTORY: Past Medical, Surgical, Social, and Family History Reviewed & Updated per EMR.   Pertinent Historical Findings include:  Past Medical History:  Diagnosis Date  . Hypertension   . Personal history of colonic adenomas 07/05/2013    Past Surgical History:  Procedure Laterality Date  . EYE SURGERY     as a toddler  . knee injury     piece of steel removed    Allergies  Allergen Reactions  . Bupropion Hcl     REACTION: not feeling well  . Penicillins     Family History  Problem Relation Age of Onset  . Stroke  Mother   . Hypertension Mother   . Heart disease Father   . Stroke Father   . Stroke Sister   . Hypertension Sister   . Colon cancer Neg Hx      Social History   Socioeconomic History  . Marital status: Married    Spouse name: Not on file  . Number of children: Not on file  . Years of education: Not on file  . Highest education level: Not on file  Social Needs  . Financial resource strain: Not on file  . Food insecurity - worry: Not on file  . Food insecurity - inability: Not on file  . Transportation needs - medical: Not on file  . Transportation needs - non-medical: Not on file  Occupational History  . Not on file  Tobacco Use  . Smoking status: Current Every Day Smoker    Packs/day: 1.50    Types: Cigarettes  . Smokeless tobacco: Never Used  Substance and Sexual Activity  . Alcohol use: Yes    Alcohol/week: 12.6 oz    Types: 21 Cans of beer per week  . Drug use: No  . Sexual activity: Yes  Other Topics Concern  . Not on file  Social History Narrative  . Not on file     PHYSICAL EXAM:  VS: BP (!) 142/78 (BP Location: Left Arm, Patient Position: Sitting, Cuff Size: Normal)   Pulse 86   Temp 98.4 F (36.9 C) (Oral)  Ht 5\' 10"  (1.778 m)   Wt 153 lb (69.4 kg)   SpO2 98%   BMI 21.95 kg/m  Physical Exam Gen: NAD, alert, cooperative with exam, well-appearing ENT: normal lips, normal nasal mucosa,  Eye: normal EOM, normal conjunctiva and lids CV:  no edema, +2 pedal pulses   Resp: no accessory muscle use, non-labored,  Skin: no rashes, no areas of induration  Neuro: normal tone, normal sensation to touch Psych:  normal insight, alert and oriented MSK:  Right Shoulder: Inspection reveals no abnormalities, atrophy or asymmetry. Palpation is normal with no tenderness over AC joint or bicipital groove. ROM is full in all planes. Rotator cuff strength normal throughout. No signs of impingement with negative Hawkin's tests, empty can sign. Speeds tests  normal. No labral pathology noted with negative Obrien's No winging of the scapula. Right scapula doesn't move as well as left.  Neurovascularly intact   Limited ultrasound: right shoulder:  Normal appearing BT  Normal subscapularis in static and dynamic process Supraspinatus with changes that could represent tendinopathy  Normal infraspinatus  Posterior capsule with normal dynamic motion   Summary: possible tendinopathy of supraspinatus   Ultrasound and interpretation by Clearance Coots, MD       Aspiration/Injection Procedure Note OLUWASEMILORE BA[facility] [birth year]  Procedure: Injection Indications: right shoulder pain  Procedure Details Consent: Risks of procedure as well as the alternatives and risks of each were explained to the (patient/caregiver).  Consent for procedure obtained. Time Out: Verified patient identification, verified procedure, site/side was marked, verified correct patient position, special equipment/implants available, medications/allergies/relevent history reviewed, required imaging and test results available.  Performed.  The area was cleaned with iodine and alcohol swabs.    The right GH joint was injected using 1 cc's of 40 mg Depomedrol and 4 cc's of 1% lidocaine with a 25 1 1/2" needle.  Ultrasound was used. Images were obtained in Transverse views showing the injection.    A sterile dressing was applied.  Patient did tolerate procedure well.      ASSESSMENT & PLAN:   Shoulder weakness He seems to notice his pain with the throwing motion and it feels deep. Possible that he has capsular tightness. Rotator cuff feels strong and no defects on [place]. Doesn't appear to be polymyalgia rheumatica. No signs of atrophy on inspection so not likely nerve compression. Possible some scapular dyskinesis attributing.  - GH injection today  - counseled on HEP  - theraband  - discussed PT. Could try NSAID. If no improvement then consider xray.

## 2018-05-17 ENCOUNTER — Telehealth: Payer: Self-pay

## 2018-05-17 MED ORDER — OLMESARTAN-AMLODIPINE-HCTZ 20-5-12.5 MG PO TABS: 1.0000 | ORAL_TABLET | Freq: Every day | ORAL | 3 refills | Status: DC

## 2018-05-17 NOTE — Telephone Encounter (Signed)
Rec'd fax from [facility], Losartan/HCT is on backorder.  Please advise

## 2018-05-17 NOTE — Telephone Encounter (Signed)
Changed to Olmesartan HCT Thx

## 2018-05-25 ENCOUNTER — Ambulatory Visit: Payer: Self-pay

## 2018-05-25 NOTE — Telephone Encounter (Signed)
Patient called in with c/o "scrotum pain." He says "it's been hurting for about 2 weeks, on the left side mostly, but some on the right. The pain is sometimes sharp, but mostly a big ache at a 3." I asked about swelling, redness, he says "no swelling or redness, but there is a tender spot on the left above the scrotum." I asked about other symptoms, he says "I have more frequent BM's the past 2 weeks and they are looser than normal." According to protocol, see PCP within 24 hours, appointment scheduled for tomorrow at 10 am at the [facility] Saturday clinic with Dr. Ethelene Hal, care advice given, patient verbalized understanding.   Reason for Disposition . [1] Pain comes and goes (intermittent) AND [2] present > 24 hours  Answer Assessment - Initial Assessment Questions 1. LOCATION and RADIATION: "Where is the pain located?"      Left mostly; right sometimes 2. QUALITY: "What does the pain feel like?"  (e.g., sharp, dull, aching, burning)     Sharp sometimes, mostly a big ache 3. SEVERITY: "How bad is the pain?"  (Scale 1-10; or mild, moderate, severe)   - MILD (1-3): doesn't interfere with normal activities    - MODERATE (4-7): interferes with normal activities (e.g., work or school) or awakens from sleep   - SEVERE (8-10): excruciating pain, unable to do any normal activities, difficulty walking     3 4. ONSET: "When did the pain start?"     Couple of weeks ago 5. PATTERN: "Does it come and go, or has it been constant since it started?"     Constant 6. SCROTAL APPEARANCE: "What does the scrotum look like?" "Is there any swelling or redness?"      No swelling, no redness 7. HERNIA: "Has a doctor ever told you that you have a hernia?"     No 8. OTHER SYMPTOMS: "Do you have any other symptoms?" (e.g., fever, abdominal pain, vomiting, difficulty passing urine)     More frequent BM's that are looser than normal for the past 2 weeks  Protocols used: SCROTAL PAIN-A-AH

## 2018-05-26 ENCOUNTER — Encounter: Payer: Self-pay | Admitting: Family Medicine

## 2018-05-26 ENCOUNTER — Ambulatory Visit: Payer: BLUE CROSS/BLUE SHIELD | Admitting: Family Medicine

## 2018-05-26 ENCOUNTER — Other Ambulatory Visit (HOSPITAL_COMMUNITY)
Admission: RE | Admit: 2018-05-26 | Discharge: 2018-05-26 | Disposition: A | Discharge status: Home / Self Care | Payer: BLUE CROSS/BLUE SHIELD | Source: Ambulatory Visit | Attending: Family Medicine | Admitting: Family Medicine

## 2018-05-26 VITALS — BP 126/78 | HR 64 | Temp 98.0°F | Ht 70.0 in | Wt 148.0 lb

## 2018-05-26 DIAGNOSIS — N453 Epididymo-orchitis: Secondary | ICD-10-CM | POA: Insufficient documentation

## 2018-05-26 DIAGNOSIS — K589 Irritable bowel syndrome without diarrhea: Secondary | ICD-10-CM | POA: Diagnosis not present

## 2018-05-26 DIAGNOSIS — B354 Tinea corporis: Secondary | ICD-10-CM | POA: Insufficient documentation

## 2018-05-26 MED ORDER — DOXYCYCLINE HYCLATE 100 MG PO TABS: 100.0000 mg | ORAL_TABLET | Freq: Two times a day (BID) | ORAL | 0 refills | Status: DC

## 2018-05-26 MED ORDER — KETOCONAZOLE 2 % EX CREA: 1.0000 "application " | TOPICAL_CREAM | Freq: Every day | CUTANEOUS | 0 refills | Status: DC

## 2018-05-26 NOTE — Addendum Note (Signed)
Addended by: Karren Cobble on: 05/26/2018 10:38 AM   Modules accepted: Orders

## 2018-05-26 NOTE — Progress Notes (Signed)
Subjective:  Patient ID: Kyle Stafford, male    DOB: 29-Jul-1961  Age: 57 y.o. MRN: [id number]  CC: No chief complaint on file.   HPI Kyle Stafford presents for 3 week ho tenderness in his left testicle. Denies fever, discharge, or dysuria. Lives with wife and is not active outside of marriage. Just found rash in axilla area. Bowels have been loose. Ho etoh abuse.  Outpatient Medications Prior to Visit  Medication Sig Dispense Refill  . amLODipine (NORVASC) 5 MG tablet Take 1 tablet (5 mg total) by mouth daily. 90 tablet 3  . aspirin EC 81 MG tablet Take 81 mg by mouth daily.    Marland Kitchen b complex vitamins tablet Take 1 tablet by mouth daily. 100 tablet 3  . Cholecalciferol (VITAMIN D3) 2000 units capsule Take 1 capsule (2,000 Units total) by mouth daily. 100 capsule 3  . Olmesartan-amLODIPine-HCTZ 20-5-12.5 MG TABS Take 1 tablet by mouth daily. 90 tablet 3   No facility-administered medications prior to visit.     ROS Review of Systems  Constitutional: Negative.   Respiratory: Negative.   Cardiovascular: Negative.   Gastrointestinal: Negative.  Negative for abdominal distention, abdominal pain, anal bleeding, blood in stool, constipation, nausea and vomiting.  Genitourinary: Positive for scrotal swelling and testicular pain. Negative for difficulty urinating, discharge and dysuria.  Musculoskeletal: Negative.  Negative for arthralgias and myalgias.  Skin: Positive for color change and rash.  Allergic/Immunologic: Negative for immunocompromised state.  Neurological: Negative for numbness.  Psychiatric/Behavioral: Negative.     Objective:  BP 126/78 (BP Location: Left Arm, Patient Position: Sitting, Cuff Size: Normal)   Pulse 64   Temp 98 F (36.7 C) (Oral)   Ht 5\' 10"  (1.778 m)   Wt 148 lb (67.1 kg)   SpO2 97%   BMI 21.24 kg/m   BP Readings from Last 3 Encounters:  05/26/18 126/78  09/26/17 (!) 142/78  09/19/17 130/70    Wt Readings from Last 3 Encounters:  05/26/18  148 lb (67.1 kg)  09/26/17 153 lb (69.4 kg)  09/19/17 152 lb 1.3 oz (69 kg)    Physical Exam  Constitutional: He is oriented to person, place, and time. He appears well-developed and well-nourished. No distress.  HENT:  Head: Normocephalic and atraumatic.  Right Ear: External ear normal.  Left Ear: External ear normal.  Eyes: Pupils are equal, round, and reactive to light. Conjunctivae and EOM are normal.  Pulmonary/Chest: Effort normal.  Abdominal: Hernia confirmed negative in the right inguinal area and confirmed negative in the left inguinal area.  Genitourinary: Penis normal. Right testis shows no mass, no swelling and no tenderness. Right testis is descended. Left testis shows tenderness. Left testis shows no mass and no swelling. Left testis is descended. No hypospadias, penile erythema or penile tenderness. No discharge found.  Lymphadenopathy: No inguinal adenopathy noted on the right or left side.  Neurological: He is alert and oriented to person, place, and time.  Skin: He is not diaphoretic.       Lab Results  Component Value Date   WBC 5.3 09/19/2017   HGB 15.9 09/19/2017   HCT 46.0 09/19/2017   PLT 234.0 09/19/2017   GLUCOSE 103 (H) 09/19/2017   CHOL 179 09/19/2017   TRIG 186.0 (H) 09/19/2017   HDL 36.30 (L) 09/19/2017   LDLDIRECT 104.1 05/15/2013   LDLCALC 105 (H) 09/19/2017   ALT 18 09/19/2017   AST 17 09/19/2017   NA 138 09/19/2017   K 4.5 09/19/2017  CL 102 09/19/2017   CREATININE 0.77 09/19/2017   BUN 10 09/19/2017   CO2 32 09/19/2017   TSH 1.88 09/19/2017   PSA 0.89 08/16/2016    No results found.  Assessment & Plan:   Diagnoses and all orders for this visit:  Orchitis and epididymitis -     doxycycline (VIBRA-TABS) 100 MG tablet; Take 1 tablet (100 mg total) by mouth 2 (two) times daily. -     Urine cytology ancillary only  Tinea corporis -     ketoconazole (NIZORAL) 2 % cream; Apply 1 application topically daily. For 10 days.  Irritable  bowel syndrome, unspecified type   I am having Kyle Stafford start on doxycycline and ketoconazole. I am also having him maintain his aspirin EC, amLODipine, Vitamin D3, b complex vitamins, and Olmesartan-amLODIPine-HCTZ.  Meds ordered this encounter  Medications  . doxycycline (VIBRA-TABS) 100 MG tablet    Sig: Take 1 tablet (100 mg total) by mouth 2 (two) times daily.    Dispense:  20 tablet    Refill:  0  . ketoconazole (NIZORAL) 2 % cream    Sig: Apply 1 application topically daily. For 10 days.    Dispense:  15 g    Refill:  0     Follow-up: Return Fu with your primary doctor within the next few weeks.Libby Maw, MD

## 2018-05-28 LAB — URINE CYTOLOGY ANCILLARY ONLY: CHLAMYDIA, DNA PROBE: NEGATIVE

## 2018-09-25 ENCOUNTER — Other Ambulatory Visit (INDEPENDENT_AMBULATORY_CARE_PROVIDER_SITE_OTHER): Payer: BLUE CROSS/BLUE SHIELD

## 2018-09-25 ENCOUNTER — Ambulatory Visit (INDEPENDENT_AMBULATORY_CARE_PROVIDER_SITE_OTHER): Payer: BLUE CROSS/BLUE SHIELD | Admitting: Internal Medicine

## 2018-09-25 ENCOUNTER — Encounter: Payer: BLUE CROSS/BLUE SHIELD | Admitting: Internal Medicine

## 2018-09-25 ENCOUNTER — Encounter: Payer: Self-pay | Admitting: Internal Medicine

## 2018-09-25 VITALS — BP 132/74 | HR 59 | Temp 97.7°F | Ht 70.0 in | Wt 143.0 lb

## 2018-09-25 DIAGNOSIS — F1020 Alcohol dependence, uncomplicated: Secondary | ICD-10-CM

## 2018-09-25 DIAGNOSIS — Z0000 Encounter for general adult medical examination without abnormal findings: Secondary | ICD-10-CM

## 2018-09-25 DIAGNOSIS — I10 Essential (primary) hypertension: Secondary | ICD-10-CM | POA: Diagnosis not present

## 2018-09-25 DIAGNOSIS — R197 Diarrhea, unspecified: Secondary | ICD-10-CM | POA: Diagnosis not present

## 2018-09-25 DIAGNOSIS — Z125 Encounter for screening for malignant neoplasm of prostate: Secondary | ICD-10-CM

## 2018-09-25 LAB — CBC WITH DIFFERENTIAL/PLATELET
Basophils Absolute: 0 10*3/uL (ref 0.0–0.1)
Basophils Relative: 0.5 % (ref 0.0–3.0)
Eosinophils Absolute: 0.1 10*3/uL (ref 0.0–0.7)
Eosinophils Relative: 1.7 % (ref 0.0–5.0)
HCT: 46.3 % (ref 39.0–52.0)
Hemoglobin: 16 g/dL (ref 13.0–17.0)
LYMPHS PCT: 26.4 % (ref 12.0–46.0)
Lymphs Abs: 1.6 10*3/uL (ref 0.7–4.0)
MCHC: 34.5 g/dL (ref 30.0–36.0)
MCV: 97.4 fl (ref 78.0–100.0)
Monocytes Absolute: 0.5 10*3/uL (ref 0.1–1.0)
Monocytes Relative: 7.8 % (ref 3.0–12.0)
NEUTROS PCT: 63.6 % (ref 43.0–77.0)
Neutro Abs: 3.8 10*3/uL (ref 1.4–7.7)
Platelets: 233 10*3/uL (ref 150.0–400.0)
RBC: 4.75 Mil/uL (ref 4.22–5.81)
RDW: 12.4 % (ref 11.5–15.5)
WBC: 6 10*3/uL (ref 4.0–10.5)

## 2018-09-25 LAB — BASIC METABOLIC PANEL
BUN: 6 mg/dL (ref 6–23)
CO2: 32 mEq/L (ref 19–32)
Calcium: 9.5 mg/dL (ref 8.4–10.5)
Chloride: 101 mEq/L (ref 96–112)
Creatinine, Ser: 0.79 mg/dL (ref 0.40–1.50)
GFR: 101.55 mL/min (ref >60.00)
Glucose, Bld: 93 mg/dL (ref 70–99)
Potassium: 3.5 mEq/L (ref 3.5–5.1)
Sodium: 140 mEq/L (ref 135–145)

## 2018-09-25 LAB — HEPATIC FUNCTION PANEL
ALK PHOS: 71 U/L (ref 39–117)
ALT: 12 U/L (ref 0–53)
AST: 16 U/L (ref 0–37)
Albumin: 4.2 g/dL (ref 3.5–5.2)
BILIRUBIN DIRECT: 0.1 mg/dL (ref 0.0–0.3)
Total Bilirubin: 0.4 mg/dL (ref 0.2–1.2)
Total Protein: 6.8 g/dL (ref 6.0–8.3)

## 2018-09-25 LAB — LIPID PANEL
Cholesterol: 160 mg/dL (ref 0–200)
HDL: 34.5 mg/dL — ABNORMAL LOW (ref >39.00)
LDL Cholesterol: 99 mg/dL (ref 0–99)
NonHDL: 125.95
Total CHOL/HDL Ratio: 5
Triglycerides: 133 mg/dL (ref 0.0–149.0)
VLDL: 26.6 mg/dL (ref 0.0–40.0)

## 2018-09-25 LAB — PSA: PSA: 1.09 ng/mL (ref 0.10–4.00)

## 2018-09-25 LAB — TSH: TSH: 1.2 u[IU]/mL (ref 0.35–4.50)

## 2018-09-25 NOTE — Assessment & Plan Note (Addendum)
8/19 ?etiol Try milk free and gluten free diet Stool tests - well water Dr Carlean Purl - will ref Last colon 2014 Imodium, Peptobismol

## 2018-09-25 NOTE — Assessment & Plan Note (Signed)
Offered a cardiac CT scan for calcium scoring test

## 2018-09-25 NOTE — Assessment & Plan Note (Signed)
10 beer/wk

## 2018-09-25 NOTE — Assessment & Plan Note (Signed)
Offered a cardiac CT scan for calcium scoring test 1/20

## 2018-09-25 NOTE — Progress Notes (Signed)
Subjective:  Patient ID: Kyle Stafford, male    DOB: 01-17-1961  Age: 58 y.o. MRN: [id number]  CC: No chief complaint on file.   HPI Kyle Stafford presents for a well exam C/o diarrhea off and on since August 2019. No night diarrhea. 3-7/day. It is watery. C/o wt loss...    Outpatient Medications Prior to Visit  Medication Sig Dispense Refill  . amLODipine (NORVASC) 5 MG tablet Take 1 tablet (5 mg total) by mouth daily. 90 tablet 3  . aspirin EC 81 MG tablet Take 81 mg by mouth daily.    Marland Kitchen b complex vitamins tablet Take 1 tablet by mouth daily. 100 tablet 3  . Cholecalciferol (VITAMIN D3) 2000 units capsule Take 1 capsule (2,000 Units total) by mouth daily. 100 capsule 3  . Olmesartan-amLODIPine-HCTZ 20-5-12.5 MG TABS Take 1 tablet by mouth daily. 90 tablet 3  . doxycycline (VIBRA-TABS) 100 MG tablet Take 1 tablet (100 mg total) by mouth 2 (two) times daily. 20 tablet 0  . ketoconazole (NIZORAL) 2 % cream Apply 1 application topically daily. For 10 days. 15 g 0   No facility-administered medications prior to visit.     ROS: Review of Systems  Constitutional: Positive for unexpected weight change. Negative for appetite change and fatigue.  HENT: Negative for congestion, nosebleeds, sneezing, sore throat and trouble swallowing.   Eyes: Negative for itching and visual disturbance.  Respiratory: Negative for cough.   Cardiovascular: Negative for chest pain, palpitations and leg swelling.  Gastrointestinal: Positive for diarrhea. Negative for abdominal distention, blood in stool and nausea.  Genitourinary: Negative for frequency and hematuria.  Musculoskeletal: Negative for back pain, gait problem, joint swelling and neck pain.  Skin: Negative for rash.  Neurological: Negative for dizziness, tremors, speech difficulty and weakness.  Psychiatric/Behavioral: Negative for agitation, dysphoric mood, sleep disturbance and suicidal ideas. The patient is not nervous/anxious.      Objective:  BP 132/74 (BP Location: Left Arm, Patient Position: Sitting, Cuff Size: Normal)   Pulse (!) 59   Temp 97.7 F (36.5 C) (Oral)   Ht 5\' 10"  (1.778 m)   Wt 143 lb (64.9 kg)   SpO2 97%   BMI 20.52 kg/m   BP Readings from Last 3 Encounters:  09/25/18 132/74  05/26/18 126/78  09/26/17 (!) 142/78    Wt Readings from Last 3 Encounters:  09/25/18 143 lb (64.9 kg)  05/26/18 148 lb (67.1 kg)  09/26/17 153 lb (69.4 kg)    Physical Exam Constitutional:      General: He is not in acute distress.    Appearance: He is well-developed.     Comments: NAD  Eyes:     Conjunctiva/sclera: Conjunctivae normal.     Pupils: Pupils are equal, round, and reactive to light.  Neck:     Musculoskeletal: Normal range of motion.     Thyroid: No thyromegaly.     Vascular: No JVD.  Cardiovascular:     Rate and Rhythm: Normal rate and regular rhythm.     Heart sounds: Normal heart sounds. No murmur. No friction rub. No gallop.   Pulmonary:     Effort: Pulmonary effort is normal. No respiratory distress.     Breath sounds: Normal breath sounds. No wheezing or rales.  Chest:     Chest wall: No tenderness.  Abdominal:     General: Bowel sounds are normal. There is no distension.     Palpations: Abdomen is soft. There is no mass.  Tenderness: There is no abdominal tenderness. There is no guarding or rebound.  Musculoskeletal: Normal range of motion.        General: No tenderness.  Lymphadenopathy:     Cervical: No cervical adenopathy.  Skin:    General: Skin is warm and dry.     Findings: No rash.  Neurological:     Mental Status: He is alert and oriented to person, place, and time.     Cranial Nerves: No cranial nerve deficit.     Motor: No abnormal muscle tone.     Coordination: Coordination normal.     Gait: Gait normal.     Deep Tendon Reflexes: Reflexes are normal and symmetric.  Psychiatric:        Behavior: Behavior normal.        Thought Content: Thought content  normal.        Judgment: Judgment normal.   pt declined rectal - per Dr Carlean Purl  Lab Results  Component Value Date   WBC 5.3 09/19/2017   HGB 15.9 09/19/2017   HCT 46.0 09/19/2017   PLT 234.0 09/19/2017   GLUCOSE 103 (H) 09/19/2017   CHOL 179 09/19/2017   TRIG 186.0 (H) 09/19/2017   HDL 36.30 (L) 09/19/2017   LDLDIRECT 104.1 05/15/2013   LDLCALC 105 (H) 09/19/2017   ALT 18 09/19/2017   AST 17 09/19/2017   NA 138 09/19/2017   K 4.5 09/19/2017   CL 102 09/19/2017   CREATININE 0.77 09/19/2017   BUN 10 09/19/2017   CO2 32 09/19/2017   TSH 1.88 09/19/2017   PSA 0.89 08/16/2016    No results found.  Assessment & Plan:   There are no diagnoses linked to this encounter.   No orders of the defined types were placed in this encounter.    Follow-up: No follow-ups on file.  Walker Kehr, MD

## 2018-09-25 NOTE — Patient Instructions (Addendum)
Imodium, Peptobismol for diarrhea   Cardiac CT calcium scoring test $150   Computed tomography, more commonly known as a CT or CAT scan, is a diagnostic medical imaging test. Like traditional x-rays, it produces multiple images or pictures of the inside of the body. The cross-sectional images generated during a CT scan can be reformatted in multiple planes. They can even generate three-dimensional images. These images can be viewed on a computer monitor, printed on film or by a 3D printer, or transferred to a CD or DVD. CT images of internal organs, bones, soft tissue and blood vessels provide greater detail than traditional x-rays, particularly of soft tissues and blood vessels. A cardiac CT scan for coronary calcium is a non-invasive way of obtaining information about the presence, location and extent of calcified plaque in the coronary arteries-the vessels that supply oxygen-containing blood to the heart muscle. Calcified plaque results when there is a build-up of fat and other substances under the inner layer of the artery. This material can calcify which signals the presence of atherosclerosis, a disease of the vessel wall, also called coronary artery disease (CAD). People with this disease have an increased risk for heart attacks. In addition, over time, progression of plaque build up (CAD) can narrow the arteries or even close off blood flow to the heart. The result may be chest pain, sometimes called "angina," or a heart attack. Because calcium is a marker of CAD, the amount of calcium detected on a cardiac CT scan is a helpful prognostic tool. The findings on cardiac CT are expressed as a calcium score. Another name for this test is coronary artery calcium scoring.  What are some common uses of the procedure? The goal of cardiac CT scan for calcium scoring is to determine if CAD is present and to what extent, even if there are no symptoms. It is a screening study that may be recommended by a  physician for patients with risk factors for CAD but no clinical symptoms. The major risk factors for CAD are: . high blood cholesterol levels  . family history of heart attacks  . diabetes  . high blood pressure  . cigarette smoking  . overweight or obese  . physical inactivity   A negative cardiac CT scan for calcium scoring shows no calcification within the coronary arteries. This suggests that CAD is absent or so minimal it cannot be seen by this technique. The chance of having a heart attack over the next two to five years is very low under these circumstances. A positive test means that CAD is present, regardless of whether or not the patient is experiencing any symptoms. The amount of calcification-expressed as the calcium score-may help to predict the likelihood of a myocardial infarction (heart attack) in the coming years and helps your medical doctor or cardiologist decide whether the patient may need to take preventive medicine or undertake other measures such as diet and exercise to lower the risk for heart attack. The extent of CAD is graded according to your calcium score:  Calcium Score  Presence of CAD  0 No evidence of CAD   1-10 Minimal evidence of CAD  11-100 Mild evidence of CAD  101-400 Moderate evidence of CAD  Over 400 Extensive evidence of CAD       Gluten free trial for 4-6 weeks. OK to use gluten-free bread and gluten-free pasta.    Gluten-Free Diet for Celiac Disease, Adult The gluten-free diet includes all foods that do not contain gluten. Gluten  is a protein that is found in wheat, rye, barley, and some other grains. Following the gluten-free diet is the only treatment for people with celiac disease. It helps to prevent damage to the intestines and improves or eliminates the symptoms of celiac disease. Following the gluten-free diet requires some planning. It can be challenging at first, but it gets easier with time and practice. There are more gluten-free  options available today than ever before. If you need help finding gluten-free foods or if you have questions, talk with your diet and nutrition specialist (registered dietitian) or your health care provider. What do I need to know about a gluten-free diet?  All fruits, vegetables, and meats are safe to eat and do not contain gluten.  When grocery shopping, start by shopping in the produce, meat, and dairy sections. These sections are more likely to contain gluten-free foods. Then move to the aisles that contain packaged foods if you need to.  Read all food labels. Gluten is often added to foods. Always check the ingredient list and look for warnings, such as "may contain gluten."  Talk with your dietitian or health care provider before taking a gluten-free multivitamin or mineral supplement.  Be aware of gluten-free foods having contact with foods that contain gluten (cross-contamination). This can happen at home and with any processed foods. ? Talk with your health care provider or dietitian about how to reduce the risk of cross-contamination in your home. ? If you have questions about how a food is processed, ask the manufacturer. What key words help to identify gluten? Foods that list any of these key words on the label usually contain gluten:  Wheat, flour, enriched flour, bromated flour, white flour, durum flour, graham flour, phosphated flour, self-rising flour, semolina, farina, barley (malt), rye, and oats.  Starch, dextrin, modified food starch, or cereal.  Thickening, fillers, or emulsifiers.  Malt flavoring, malt extract, or malt syrup.  Hydrolyzed vegetable protein.  In the [place] packaged foods that are gluten-free are required to be labeled "GF." These foods should be easy to identify and are safe to eat. In the [place] food companies are also required to list common food allergens, including wheat, on their labels. Recommended foods Grains  Amaranth, bean flours, 100%  buckwheat flour, corn, millet, nut flours or nut meals, GF oats, quinoa, rice, sorghum, teff, rice wafers, pure cornmeal tortillas, popcorn, and hot cereals made from cornmeal. Hominy, rice, wild rice. Some Asian rice noodles or bean noodles. Arrowroot starch, corn bran, corn flour, corn germ, cornmeal, corn starch, potato flour, potato starch flour, and rice bran. Plain, brown, and sweet rice flours. Rice polish, soy flour, and tapioca starch. Vegetables  All plain fresh, frozen, and canned vegetables. Fruits  All plain fresh, frozen, canned, and dried fruits, and 100% fruit juices. Meats and other protein foods  All fresh beef, pork, poultry, fish, seafood, and eggs. Fish canned in water, oil, brine, or vegetable broth. Plain nuts and seeds, peanut butter. Some lunch meat and some frankfurters. Dried beans, dried peas, and lentils. Dairy  Fresh plain, dry, evaporated, or condensed milk. Cream, butter, sour cream, whipping cream, and most yogurts. Unprocessed cheese, most processed cheeses, some cottage cheese, some cream cheeses. Beverages  Coffee, tea, most herbal teas. Carbonated beverages and some root beers. Wine, sake, and distilled spirits, such as gin, vodka, and whiskey. Most hard ciders. Fats and oils  Butter, margarine, vegetable oil, hydrogenated butter, olive oil, shortening, lard, cream, and some mayonnaise. Some commercial salad dressings.  Olives. Sweets and desserts  Sugar, honey, some syrups, molasses, jelly, and jam. Plain hard candy, marshmallows, and gumdrops. Pure cocoa powder. Plain chocolate. Custard and some pudding mixes. Gelatin desserts, sorbets, frozen ice pops, and sherbet. Cake, cookies, and other desserts prepared with allowed flours. Some commercial ice creams. Cornstarch, tapioca, and rice puddings. Seasoning and other foods  Some canned or frozen soups. Monosodium glutamate (MSG). Cider, rice, and wine vinegar. Baking soda and baking powder. Cream of tartar.  Baking and nutritional yeast. Certain soy sauces made without wheat (ask your dietitian about specific brands that are allowed). Nuts, coconut, and chocolate. Salt, pepper, herbs, spices, flavoring extracts, imitation or artificial flavorings, natural flavorings, and food colorings. Some medicines and supplements. Some lip glosses and other cosmetics. Rice syrups. The items listed may not be a complete list. Talk with your dietitian about what dietary choices are best for you. Foods to avoid Grains  Barley, bran, bulgur, couscous, cracked wheat, [place], farro, graham, malt, matzo, semolina, wheat germ, and all wheat and rye cereals including spelt and kamut. Cereals containing malt as a flavoring, such as rice cereal. Noodles, spaghetti, macaroni, most packaged rice mixes, and all mixes containing wheat, rye, barley, or triticale. Vegetables  Most creamed vegetables and most vegetables canned in sauces. Some commercially prepared vegetables and salads. Fruits  Thickened or prepared fruits and some pie fillings. Some fruit snacks and fruit roll-ups. Meats and other protein foods  Any meat or meat alternative containing wheat, rye, barley, or gluten stabilizers. These are often marinated or packaged meats and lunch meats. Bread-containing products, such as Swiss steak, croquettes, meatballs, and meatloaf. Most tuna canned in vegetable broth and [place] with hydrolyzed vegetable protein (HVP) injected as part of the basting. Seitan. Imitation fish. Eggs in sauces made from ingredients to avoid. Dairy  Commercial chocolate milk drinks and malted milk. Some non-dairy creamers. Any cheese product containing ingredients to avoid. Beverages  Certain cereal beverages. Beer, ale, malted milk, and some root beers. Some hard ciders. Some instant flavored coffees. Some herbal teas made with barley or with barley malt added. Fats and oils  Some commercial salad dressings. Sour cream containing modified food  starch. Sweets and desserts  Some toffees. Chocolate-coated nuts (may be rolled in wheat flour) and some commercial candies and candy bars. Most cakes, cookies, donuts, pastries, and other baked goods. Some commercial ice cream. Ice cream cones. Commercially prepared mixes for cakes, cookies, and other desserts. Bread pudding and other puddings thickened with flour. Products containing brown rice syrup made with barley malt enzyme. Desserts and sweets made with malt flavoring. Seasoning and other foods  Some curry powders, some dry seasoning mixes, some gravy extracts, some meat sauces, some ketchups, some prepared mustards, and horseradish. Certain soy sauces. Malt vinegar. Bouillon and bouillon cubes that contain HVP. Some chip dips, and some chewing gum. Yeast extract. Brewer's yeast. Caramel color. Some medicines and supplements. Some lip glosses and other cosmetics. The items listed may not be a complete list. Talk with your dietitian about what dietary choices are best for you. Summary  Gluten is a protein that is found in wheat, rye, barley, and some other grains. The gluten-free diet includes all foods that do not contain gluten.  If you need help finding gluten-free foods or if you have questions, talk with your diet and nutrition specialist (registered dietitian) or your health care provider.  Read all food labels. Gluten is often added to foods. Always check the ingredient list and look for  warnings, such as "may contain gluten." This information is not intended to replace advice given to you by your health care provider. Make sure you discuss any questions you have with your health care provider. Document Released: 08/15/2005 Document Revised: 05/30/2016 Document Reviewed: 05/30/2016 Elsevier Interactive Patient Education  2018 [employer].   Lactose Intolerance, Adult Lactose is a natural sugar that is found in dairy milk and dairy products such as cheese and yogurt. Lactose is  digested by lactase, a protein (enzyme) in your small intestine. Some people do not produce enough lactase to digest lactose. This is called lactose intolerance. Lactose intolerance is different from milk allergy, which is a more serious reaction to the protein in milk. What are the causes? Causes of lactose intolerance may include:  Normal aging. The ability to produce lactase may lessen with age, causing lactose intolerance over time.  Being born without the ability to make lactase.  Digestive diseases such as gastroenteritis or inflammatory bowel disease (IBD).  Surgery or injury to your small intestine.  Infection in your intestines.  Certain antibiotic medicines and cancer treatments. What are the signs or symptoms? Lactose intolerance can cause discomfort within 30 minutes to 2 hours after you eat or drink something that contains lactose. Symptoms may include:  Nausea.  Diarrhea.  Cramps or pain in the abdomen.  A full, tight, or painful feeling in the abdomen (bloating).  Gas. How is this diagnosed? This condition may be diagnosed based on:  Your symptoms and medical history.  Lactose tolerance test. This test involves drinking a lactose solution and then having blood tests to measure the amount of glucose in your blood. If your blood glucose level does not go up, it means your body is not able to digest the lactose.  Lactose breath test (hydrogen breath test). This test involves drinking a lactose solution and then exhaling into a type of bag while you digest the solution. Having a lot of hydrogen in your breath can be a sign of lactose intolerance.  Stool acidity test. This involves drinking a lactose solution and then having your stool samples tested for bacteria. Having a lot of bacteria causes stool to be considered acidic, which is a sign of lactose intolerance. How is this treated? There is no treatment to improve your body's ability to produce lactase. However,  you can manage your symptoms at home by:  Limiting or avoiding dairy milk, dairy products, and other sources of lactose.  Taking lactase tablets when you eat milk products. Lactase tablets are over-the-counter medicines that help to improve lactose digestion. You may also add lactase drops to regular milk.  Adjusting your diet, such as drinking lactose-free milk. Lactose tolerance varies from person to person. Some people may be able to eat or drink small amounts of products that contain lactose, and other people may need to avoid all foods and drinks that contain lactose. Talk with your health care provider about what treatment is best for you. Follow these instructions at home:  Limit or avoid foods, beverages, and medicines that contain lactose, as told by your health care provider. Keep track of which foods, beverages, or medicines cause symptoms so you can decide what to avoid in the future.  Read food and medicine labels carefully. Avoid products that contain: ? Lactose. ? Milk solids. ? Casein. ? Whey.  Take over-the-counter and prescription medicines (including lactase tablets) only as told by your health care provider.  If you stop eating and drinking dairy products (eliminate  dairy from your diet), make sure to get enough protein, calcium, and vitamin D from other foods. Work with your health care provider or a diet and nutrition specialist (dietitian) to make sure you get enough of those nutrients.  Choose a milk substitute that is fortified with calcium and vitamin D. ? Soy milk contains high-quality protein. ? Milks that are made from nuts or grains (such as almond milk and rice milk) contain very small amounts of protein.  Keep all follow-up visits as told by your health care provider. This is important. Contact a health care provider if:  You have no relief from your symptoms after you have eliminated milk products and other sources of lactose. Get help right away  if:  You have blood in your stool.  You have severe abdomen (abdominal) pain. Summary  Lactose is a natural sugar that is found in dairy milk and dairy products such as cheese and yogurt. Lactose is digested by lactase, which is a protein (enzyme) in the small intestine.  Some people do not produce enough lactase to digest lactose. This is called lactose intolerance.  Lactose intolerance can cause discomfort within 30 minutes to 2 hours after you eat or drink something that contains lactose.  Limit or avoid foods, beverages, and medicines that contain lactose, as told by your health care provider. This information is not intended to replace advice given to you by your health care provider. Make sure you discuss any questions you have with your health care provider. Document Released: 08/15/2005 Document Revised: 03/31/2017 Document Reviewed: 03/31/2017 Elsevier Interactive Patient Education  2019 [employer].

## 2018-09-28 ENCOUNTER — Other Ambulatory Visit (INDEPENDENT_AMBULATORY_CARE_PROVIDER_SITE_OTHER): Payer: BLUE CROSS/BLUE SHIELD

## 2018-09-28 DIAGNOSIS — R197 Diarrhea, unspecified: Secondary | ICD-10-CM

## 2018-09-28 DIAGNOSIS — Z0000 Encounter for general adult medical examination without abnormal findings: Secondary | ICD-10-CM

## 2018-09-28 LAB — URINALYSIS
Hgb urine dipstick: NEGATIVE
Ketones, ur: NEGATIVE
Leukocytes, UA: NEGATIVE
Nitrite: NEGATIVE
Specific Gravity, Urine: >=1.03 — AB (ref 1.000–1.030)
Total Protein, Urine: NEGATIVE
UROBILINOGEN UA: 0.2 (ref 0.0–1.0)
Urine Glucose: NEGATIVE
pH: 6 (ref 5.0–8.0)

## 2018-09-28 NOTE — Addendum Note (Signed)
Addended by: Trenda Moots on: 7/41/2878 09:10 AM   Modules accepted: Orders

## 2018-09-30 LAB — CLOSTRIDIUM DIFFICILE EIA: C difficile Toxins A+B, EIA: NEGATIVE

## 2018-09-30 LAB — SPECIMEN STATUS REPORT

## 2018-10-03 LAB — GIARDIA/CRYPTOSPORIDIUM (EIA)
MICRO NUMBER: 134127
MICRO NUMBER:: 134126
RESULT: NOT DETECTED
RESULT:: NOT DETECTED
SPECIMEN QUALITY:: ADEQUATE
SPECIMEN QUALITY:: ADEQUATE

## 2018-10-16 ENCOUNTER — Ambulatory Visit (INDEPENDENT_AMBULATORY_CARE_PROVIDER_SITE_OTHER)
Admission: RE | Admit: 2018-10-16 | Discharge: 2018-10-16 | Disposition: A | Discharge status: Home / Self Care | Payer: Self-pay | Source: Ambulatory Visit | Attending: Internal Medicine | Admitting: Internal Medicine

## 2018-10-16 DIAGNOSIS — I10 Essential (primary) hypertension: Secondary | ICD-10-CM

## 2018-10-18 ENCOUNTER — Other Ambulatory Visit: Payer: Self-pay | Admitting: Internal Medicine

## 2018-10-18 DIAGNOSIS — I2510 Atherosclerotic heart disease of native coronary artery without angina pectoris: Secondary | ICD-10-CM

## 2018-10-18 DIAGNOSIS — I2583 Coronary atherosclerosis due to lipid rich plaque: Principal | ICD-10-CM

## 2018-10-18 MED ORDER — ATORVASTATIN CALCIUM 10 MG PO TABS: 10.0000 mg | ORAL_TABLET | Freq: Every day | ORAL | 11 refills | Status: DC

## 2018-10-29 ENCOUNTER — Encounter: Payer: Self-pay | Admitting: Internal Medicine

## 2018-10-29 ENCOUNTER — Telehealth: Payer: Self-pay | Admitting: Gastroenterology

## 2018-10-29 NOTE — Telephone Encounter (Signed)
A user error has taken place: ERROR °

## 2018-11-01 ENCOUNTER — Encounter: Payer: Self-pay | Admitting: Internal Medicine

## 2018-11-06 ENCOUNTER — Ambulatory Visit: Payer: BLUE CROSS/BLUE SHIELD | Admitting: Cardiology

## 2018-11-06 ENCOUNTER — Encounter: Payer: Self-pay | Admitting: Cardiology

## 2018-11-06 VITALS — BP 142/70 | HR 70 | Ht 70.0 in | Wt 140.0 lb

## 2018-11-06 DIAGNOSIS — I2510 Atherosclerotic heart disease of native coronary artery without angina pectoris: Secondary | ICD-10-CM | POA: Diagnosis not present

## 2018-11-06 DIAGNOSIS — Z716 Tobacco abuse counseling: Secondary | ICD-10-CM | POA: Diagnosis not present

## 2018-11-06 DIAGNOSIS — Z712 Person consulting for explanation of examination or test findings: Secondary | ICD-10-CM

## 2018-11-06 DIAGNOSIS — I10 Essential (primary) hypertension: Secondary | ICD-10-CM | POA: Diagnosis not present

## 2018-11-06 DIAGNOSIS — I2584 Coronary atherosclerosis due to calcified coronary lesion: Secondary | ICD-10-CM

## 2018-11-06 DIAGNOSIS — Z79899 Other long term (current) drug therapy: Secondary | ICD-10-CM | POA: Diagnosis not present

## 2018-11-06 DIAGNOSIS — Z7189 Other specified counseling: Secondary | ICD-10-CM

## 2018-11-06 DIAGNOSIS — E7800 Pure hypercholesterolemia, unspecified: Secondary | ICD-10-CM

## 2018-11-06 MED ORDER — ATORVASTATIN CALCIUM 40 MG PO TABS: 40.0000 mg | ORAL_TABLET | Freq: Every day | ORAL | 3 refills | Status: DC

## 2018-11-06 NOTE — Progress Notes (Signed)
Cardiology Office Note:    Date:  11/06/2018   ID:  Kyle Stafford, DOB 07/23/1961, MRN [id number]  PCP:  Cassandria Anger, MD  Cardiologist:  Buford Dresser, MD PhD  Referring MD: Cassandria Anger, MD   CC: discuss results of calcium score from PCP  History of Present Illness:    Kyle Stafford is a 58 y.o. male with a hx of hypertension, hyperlipidemia, tobacco abuse who is seen as a new consult at the request of Plotnikov, Evie Lacks, MD for the evaluation and management of CAD detected by coronary calcium score.  He had a CT cardiac score on 10/16/18 which showed LM/3V calcification with a score of 1416 (99th percentile). His most recent lipids from 09/25/18 noted Tchol 160, TG 133, HDL 34, LDL 99, ratio of 5. This was prior to being started on atorvastatin 10 mg.   Cardiovascular risk factors: Prior clinical ASCVD: none Comorbid conditions: HTN, HLD Metabolic syndrome/Obesity: none, eats very little Chronic inflammatory conditions: none Tobacco use history: 40 years, 1.5 ppd. He feels controlled by his tobacco addiction. Has tried once time to quit, cold [place], only lasted 3 days. Family history: he was adopted, history is unknown. Was premature when he was born.  Prior cardiac testing: calcium score, as above Exercise level: works 8 hours/day (third shift), active that entire time. Walks 4-6 miles/day when he works. No chest pain. Does feel stressed and nervous a lot, related to his job. Does get short of breath with digging holes, vigorous exercise, etc. Climbs stairs, pushes his 1500 lbs toolbox around. No shortness of breath at rest. Current diet: doesn't eat well, but doesn't eat a lot  Denies chest pain, shortness of breath at rest or with normal exertion. No PND, orthopnea, LE edema or unexpected weight gain. No syncope or palpitations. Does occasionally feel lightheaded on changing position.  We spent significant time of this visit discussing plaque  formation and how calcium occurs, management of risk factors, options for additional testing, and indications for stents.  Past Medical History:  Diagnosis Date  . Hypertension   . Personal history of colonic adenomas 07/05/2013    Past Surgical History:  Procedure Laterality Date  . EYE SURGERY     as a toddler  . knee injury     piece of steel removed    Current Medications: Current Outpatient Medications on File Prior to Visit  Medication Sig  . amLODipine (NORVASC) 5 MG tablet Take 1 tablet (5 mg total) by mouth daily.  Marland Kitchen aspirin EC 81 MG tablet Take 81 mg by mouth daily.  Marland Kitchen b complex vitamins tablet Take 1 tablet by mouth daily.  . Cholecalciferol (VITAMIN D3) 2000 units capsule Take 1 capsule (2,000 Units total) by mouth daily.  . Olmesartan-amLODIPine-HCTZ 20-5-12.5 MG TABS Take 1 tablet by mouth daily.   No current facility-administered medications on file prior to visit.     He isn't sure what his current combo pill is but he thinks it was switched.  Allergies:   Bupropion hcl and Penicillins   Social History   Socioeconomic History  . Marital status: Married    Spouse name: Not on file  . Number of children: Not on file  . Years of education: Not on file  . Highest education level: Not on file  Occupational History  . Not on file  Social Needs  . Financial resource strain: Not on file  . Food insecurity:    Worry: Not on file  Inability: Not on file  . Transportation needs:    Medical: Not on file    Non-medical: Not on file  Tobacco Use  . Smoking status: Current Every Day Smoker    Packs/day: 1.50    Types: Cigarettes  . Smokeless tobacco: Never Used  Substance and Sexual Activity  . Alcohol use: Yes    Alcohol/week: 21.0 standard drinks    Types: 21 Cans of beer per week  . Drug use: No  . Sexual activity: Yes  Lifestyle  . Physical activity:    Days per week: Not on file    Minutes per session: Not on file  . Stress: Not on file    Relationships  . Social connections:    Talks on phone: Not on file    Gets together: Not on file    Attends religious service: Not on file    Active member of club or organization: Not on file    Attends meetings of clubs or organizations: Not on file    Relationship status: Not on file  Other Topics Concern  . Not on file  Social History Narrative  . Not on file     Family History: The patient is adopted and does not know his biological family history.  ROS:   Please see the history of present illness.  Additional pertinent ROS:  Constitutional: Negative for chills, fever, night sweats. Has been losing weight but endorses chronic low appetite. HENT: Negative for ear pain and hearing loss.   Eyes: Negative for loss of vision and eye pain.  Respiratory: Negative for cough, sputum, shortness of breath, wheezing.   Cardiovascular: See HPI. Gastrointestinal: Negative for abdominal pain, melena, and hematochezia. Positive for several months of diarrhea. Genitourinary: Negative for dysuria and hematuria.  Musculoskeletal: Negative for falls and myalgias.  Skin: Negative for itching and rash.  Neurological: Negative for focal weakness, focal sensory changes and loss of consciousness.  Endo/Heme/Allergies: Does not bruise/bleed easily.    EKGs/Labs/Other Studies Reviewed:    The following studies were reviewed today: Lipids, CT cardiac score  EKG:  EKG is personally reviewed.  The ekg ordered today demonstrates NSR, RAE  Recent Labs: 09/25/2018: ALT 12; BUN 6; Creatinine, Ser 0.79; Hemoglobin 16.0; Platelets 233.0; Potassium 3.5; Sodium 140; TSH 1.20  Recent Lipid Panel    Component Value Date/Time   CHOL 160 09/25/2018 0957   TRIG 133.0 09/25/2018 0957   HDL 34.50 (L) 09/25/2018 0957   CHOLHDL 5 09/25/2018 0957   VLDL 26.6 09/25/2018 0957   LDLCALC 99 09/25/2018 0957   LDLDIRECT 104.1 05/15/2013 1301    Physical Exam:    VS:  BP (!) 142/70   Pulse 70   Ht 5\' 10"   (1.778 m)   Wt 140 lb (63.5 kg)   BMI 20.09 kg/m     Wt Readings from Last 3 Encounters:  11/06/18 140 lb (63.5 kg)  09/25/18 143 lb (64.9 kg)  05/26/18 148 lb (67.1 kg)     GEN: Well nourished, well developed in no acute distress HEENT: Normal NECK: No JVD; No carotid bruits LYMPHATICS: No lymphadenopathy CARDIAC: regular rhythm, normal S1 and S2, no murmurs, rubs, gallops. Radial and DP pulses 2+ bilaterally. RESPIRATORY:  Clear to auscultation without rales, rhonchi. Mild expiratory wheezing. ABDOMEN: Soft, non-tender, non-distended MUSCULOSKELETAL:  No edema; No deformity  SKIN: Warm and dry NEUROLOGIC:  Alert and oriented x 3 PSYCHIATRIC:  Normal affect   ASSESSMENT:    1. Encounter to discuss test results  2. Coronary artery disease due to calcified coronary lesion   3. Medication management   4. Essential hypertension   5. Pure hypercholesterolemia   6. Tobacco abuse counseling    PLAN:    CAD based on coronary calcium score: we spent significant time today discussing the etiology of this, risk factors, and management. We discussed treadmill nuclear testing to evaluate blood flow and echocardiogram to evaluate wall motion. As he is asymptomatic, we discussed indications for stent/bypass surgery would be new symptoms or reduced ejection fraction.   -he declines additional testing at this time, but we discussed that if he develops any new/concerning symptoms, he needs to seek immediate medical attention if symptoms are severe or contact me if they are mild and self resolving, at which time we would move to cath -he was recently started on atorvastatin 10 mg. Given the extent of his calcium, I increased him to 40 mg atorvastatin.  -discussed importance of tobacco cessation, below -on aspirin 81 mg  Tobacco abuse: The patient was counseled on tobacco cessation today for 7 minutes.  Counseling included reviewing the risks of smoking tobacco products, how it impacts the  patient's current medical diagnoses and different strategies for quitting.  Pharmacotherapy to aid in tobacco cessation was not prescribed today.  Hypertension: above goal today, but endorses symptoms of intermittent orthostasis.Will continue to monitor. He recently had his BP medication switched and is unclear of the name/dosing. If BP elevated on follow up, will need to make adjustments.  Hyperlipidemia: hypercholesterolemia, with LDL goal <70 -discussed lipoprotein A testing, though he has recently started a statin. Declines at this time. -unknown family history. -increased to 40 mg atorvastatin today, as above -recheck lipids and LFTS in 3 mos  Cardiac risk counseling and prevention recommendations: -recommend heart healthy/Mediterranean diet, with whole grains, fruits, vegetable, fish, lean meats, nuts, and olive oil. Limit salt. -recommend moderate walking, 3-5 times/week for 30-50 minutes each session. Aim for at least 150 minutes.week. Goal should be pace of 3 miles/hours, or walking 1.5 miles in 30 minutes -recommend avoidance of tobacco products. Avoid excess alcohol. -Additional risk factor control:  -Diabetes: A1c is not available, denies history of diabetes  -Lipids: as above  -Blood pressure control: as above  -Weight: BMI 20  Plan for follow up: 3 mos or sooner PRN  Medication Adjustments/Labs and Tests Ordered: Current medicines are reviewed at length with the patient today.  Concerns regarding medicines are outlined above.  Orders Placed This Encounter  Procedures  . Lipid panel  . Hepatic function panel  . EKG 12-Lead   Meds ordered this encounter  Medications  . atorvastatin (LIPITOR) 40 MG tablet    Sig: Take 1 tablet (40 mg total) by mouth daily.    Dispense:  90 tablet    Refill:  3    Patient Instructions  Medication Instructions:  Increase: Atorvastatin 40 mg daily  If you need a refill on your cardiac medications before your next appointment, please  call your pharmacy.   Lab work: Your physician recommends that you return for lab work in 3 months ( fasting lipid. LFT)  If you have labs (blood work) drawn today and your tests are completely normal, you will receive your results only by: Marland Kitchen MyChart Message (if you have MyChart) OR . A paper copy in the mail If you have any lab test that is abnormal or we need to change your treatment, we will call you to review the results.  Testing/Procedures: None  Follow-Up: At [facility], you and your health needs are our priority.  As part of our continuing mission to provide you with exceptional heart care, we have created designated Provider Care Teams.  These Care Teams include your primary Cardiologist (physician) and Advanced Practice Providers (APPs -  Physician Assistants and Nurse Practitioners) who all work together to provide you with the care you need, when you need it. You will need a follow up appointment in 3 months.  Please call our office 2 months in advance to schedule this appointment.  You may see Dr. Harrell Gave or one of the following Advanced Practice Providers on your designated Care Team:   Rosaria Ferries, PA-C . Jory Sims, DNP, ANP        Signed, Buford Dresser, MD PhD 11/06/2018 9:59 AM    [place] [address]

## 2018-11-06 NOTE — Patient Instructions (Signed)
Medication Instructions:  Increase: Atorvastatin 40 mg daily  If you need a refill on your cardiac medications before your next appointment, please call your pharmacy.   Lab work: Your physician recommends that you return for lab work in 3 months ( fasting lipid. LFT)  If you have labs (blood work) drawn today and your tests are completely normal, you will receive your results only by: Marland Kitchen MyChart Message (if you have MyChart) OR . A paper copy in the mail If you have any lab test that is abnormal or we need to change your treatment, we will call you to review the results.  Testing/Procedures: None  Follow-Up: At [facility], you and your health needs are our priority.  As part of our continuing mission to provide you with exceptional heart care, we have created designated Provider Care Teams.  These Care Teams include your primary Cardiologist (physician) and Advanced Practice Providers (APPs -  Physician Assistants and Nurse Practitioners) who all work together to provide you with the care you need, when you need it. You will need a follow up appointment in 3 months.  Please call our office 2 months in advance to schedule this appointment.  You may see Dr. Harrell Gave or one of the following Advanced Practice Providers on your designated Care Team:   Rosaria Ferries, PA-C . Jory Sims, DNP, ANP

## 2018-11-18 ENCOUNTER — Telehealth: Payer: Self-pay | Admitting: *Deleted

## 2018-11-18 NOTE — Telephone Encounter (Signed)
Covid-19 travel screening questions  Have you traveled in the last 14 days? no If yes where?  Do you now or have you had a fever in the last 14 days? no  Do you have any respiratory symptoms of shortness of breath or cough now or in the last 14 days? no  Do you have a medical history of Congestive Heart Failure? no  Do you have a medical history of lung disease? no  Do you have any family members or close contacts with diagnosed or suspected Covid-19? no       

## 2018-11-18 NOTE — Telephone Encounter (Signed)
LMOM

## 2018-11-19 ENCOUNTER — Ambulatory Visit (AMBULATORY_SURGERY_CENTER): Payer: Self-pay | Admitting: *Deleted

## 2018-11-19 ENCOUNTER — Encounter: Payer: Self-pay | Admitting: Internal Medicine

## 2018-11-19 ENCOUNTER — Other Ambulatory Visit: Payer: Self-pay

## 2018-11-19 VITALS — Temp 97.8°F | Ht 70.0 in | Wt 141.2 lb

## 2018-11-19 DIAGNOSIS — Z86010 Personal history of colonic polyps: Secondary | ICD-10-CM

## 2018-11-19 NOTE — Progress Notes (Signed)
No egg or soy allergy known to patient  No issues with past sedation with any surgeries  or procedures, no intubation problems  No diet pills per patient No home 02 use per patient  No blood thinners per patient  Pt denies issues with constipation  No A fib or A flutter   

## 2018-11-28 ENCOUNTER — Telehealth: Payer: Self-pay

## 2018-11-28 NOTE — Telephone Encounter (Signed)
Called patient and informed him that his procedure would have to be cancelled and rescheduled d/t COVID-19. Patient agreed and understood.

## 2018-12-04 ENCOUNTER — Encounter: Payer: BLUE CROSS/BLUE SHIELD | Admitting: Internal Medicine

## 2019-01-02 ENCOUNTER — Telehealth: Payer: Self-pay | Admitting: *Deleted

## 2019-01-02 NOTE — Telephone Encounter (Signed)
LM on VM to call back to reschedule postponed procedure.

## 2019-01-02 NOTE — Telephone Encounter (Signed)
Patient returned phone call. °

## 2019-01-07 NOTE — Telephone Encounter (Signed)
New instructions mailed to pt. 

## 2019-01-07 NOTE — Telephone Encounter (Signed)
Spoke with patient and rescheduled postponed procedure.  Rescheduled for 6/15 at 730.  Will send new prep instructions via mail.

## 2019-02-08 ENCOUNTER — Telehealth: Payer: Self-pay

## 2019-02-08 NOTE — Telephone Encounter (Signed)
Left message and a call back number

## 2019-02-11 ENCOUNTER — Other Ambulatory Visit: Payer: BC Managed Care – PPO

## 2019-02-11 ENCOUNTER — Encounter: Payer: Self-pay | Admitting: Internal Medicine

## 2019-02-11 ENCOUNTER — Other Ambulatory Visit: Payer: Self-pay

## 2019-02-11 ENCOUNTER — Ambulatory Visit (AMBULATORY_SURGERY_CENTER): Payer: BC Managed Care – PPO | Admitting: Internal Medicine

## 2019-02-11 VITALS — BP 117/65 | HR 59 | Temp 98.4°F | Resp 12 | Ht 70.0 in | Wt 140.0 lb

## 2019-02-11 DIAGNOSIS — K52832 Lymphocytic colitis: Secondary | ICD-10-CM

## 2019-02-11 DIAGNOSIS — R197 Diarrhea, unspecified: Secondary | ICD-10-CM

## 2019-02-11 DIAGNOSIS — K5520 Angiodysplasia of colon without hemorrhage: Secondary | ICD-10-CM | POA: Diagnosis not present

## 2019-02-11 DIAGNOSIS — Z86010 Personal history of colonic polyps: Secondary | ICD-10-CM

## 2019-02-11 MED ORDER — SODIUM CHLORIDE 0.9 % IV SOLN: 500.0000 mL | Freq: Once | INTRAVENOUS | Status: DC

## 2019-02-11 NOTE — Patient Instructions (Addendum)
I did not see any polyps or cancer. One small spot called AVM or angiodysplasia. Blood vessels on surface.  I took biopsies to see what might cause the diarrhea and will let you know. One of your medications - olmesartan - part of the blood pressure medication is a possible cause.  I appreciate the opportunity to care for you. Gatha Mayer, MD, [facility]  Continue present medications. Await pathology results.    YOU HAD AN ENDOSCOPIC PROCEDURE TODAY AT [facility]:   Refer to the procedure report that was given to you for any specific questions about what was found during the examination.  If the procedure report does not answer your questions, please call your gastroenterologist to clarify.  If you requested that your care partner not be given the details of your procedure findings, then the procedure report has been included in a sealed envelope for you to review at your convenience later.  YOU SHOULD EXPECT: Some feelings of bloating in the abdomen. Passage of more gas than usual.  Walking can help get rid of the air that was put into your GI tract during the procedure and reduce the bloating. If you had a lower endoscopy (such as a colonoscopy or flexible sigmoidoscopy) you may notice spotting of blood in your stool or on the toilet paper. If you underwent a bowel prep for your procedure, you may not have a normal bowel movement for a few days.  Please Note:  You might notice some irritation and congestion in your nose or some drainage.  This is from the oxygen used during your procedure.  There is no need for concern and it should clear up in a day or so.  SYMPTOMS TO REPORT IMMEDIATELY:   Following lower endoscopy (colonoscopy or flexible sigmoidoscopy):  Excessive amounts of blood in the stool  Significant tenderness or worsening of abdominal pains  Swelling of the abdomen that is new, acute  Fever of 100F or higher    For urgent or emergent issues, a  gastroenterologist can be reached at any hour by calling [phone].   DIET:  We do recommend a small meal at first, but then you may proceed to your regular diet.  Drink plenty of fluids but you should avoid alcoholic beverages for 24 hours.  ACTIVITY:  You should plan to take it easy for the rest of today and you should NOT DRIVE or use heavy machinery until tomorrow (because of the sedation medicines used during the test).    FOLLOW UP: Our staff will call the number listed on your records 48-72 hours following your procedure to check on you and address any questions or concerns that you may have regarding the information given to you following your procedure. If we do not reach you, we will leave a message.  We will attempt to reach you two times.  During this call, we will ask if you have developed any symptoms of COVID 19. If you develop any symptoms (ie: fever, flu-like symptoms, shortness of breath, cough etc.) before then, please call [phone].  If you test positive for Covid 19 in the 2 weeks post procedure, please call and report this information to [place].    If any biopsies were taken you will be contacted by phone or by letter within the next 1-3 weeks.  Please call us at [phone] if you have not heard about the biopsies in 3 weeks.    SIGNATURES/CONFIDENTIALITY: You and/or your care partner  have signed paperwork which will be entered into your electronic medical record.  These signatures attest to the fact that that the information above on your After Visit Summary has been reviewed and is understood.  Full responsibility of the confidentiality of this discharge information lies with you and/or your care-partner.

## 2019-02-11 NOTE — Progress Notes (Signed)
Pt's states no medical or surgical changes since previsit or office visit.  Temp-te Vital signs-sb

## 2019-02-11 NOTE — Progress Notes (Signed)
Pt transferred to recovery RN. Report given, VSS. Pt awake. No anesthetic complications noted

## 2019-02-11 NOTE — Progress Notes (Signed)
Called to room to assist during endoscopic procedure.  Patient ID and intended procedure confirmed with present staff. Received instructions for my participation in the procedure from the performing physician.  

## 2019-02-11 NOTE — Op Note (Signed)
[place] Patient Name: Kyle Stafford Procedure Date: 02/11/2019 7:39 AM MRN: [id number] Endoscopist: Gatha Mayer , MD Age: 58 Referring MD:  Date of Birth: 21-Jan-1961 Gender: Male Account #: [id number] Procedure:                Colonoscopy Indications:              Surveillance: Personal history of adenomatous                            polyps on last colonoscopy > 5 years ago - ALSO                            HAVING DIARRHEA X 10 MOS Medicines:                Propofol per Anesthesia, Monitored Anesthesia Care Procedure:                Pre-Anesthesia Assessment:                           - Prior to the procedure, a History and Physical                            was performed, and patient medications and                            allergies were reviewed. The patient's tolerance of                            previous anesthesia was also reviewed. The risks                            and benefits of the procedure and the sedation                            options and risks were discussed with the patient.                            All questions were answered, and informed consent                            was obtained. Prior Anticoagulants: The patient has                            taken no previous anticoagulant or antiplatelet                            agents. ASA Grade Assessment: II - A patient with                            mild systemic disease. After reviewing the risks                            and benefits, the patient was deemed in  satisfactory condition to undergo the procedure.                           After obtaining informed consent, the colonoscope                            was passed under direct vision. Throughout the                            procedure, the patient's blood pressure, pulse, and                            oxygen saturations were monitored continuously. The                            Model PCF-H190DL  [id number]) scope was introduced                            through the anus and advanced to the the terminal                            ileum, with identification of the appendiceal                            orifice and IC valve. The colonoscopy was performed                            without difficulty. The patient tolerated the                            procedure well. The quality of the bowel                            preparation was good. The terminal ileum, ileocecal                            valve, appendiceal orifice, and rectum were                            photographed. The bowel preparation used was                            Miralax via split dose instruction. Scope In: 7:46:52 AM Scope Out: 8:02:03 AM Scope Withdrawal Time: 0 hours 13 minutes 19 seconds  Total Procedure Duration: 0 hours 15 minutes 11 seconds  Findings:                 The perianal and digital rectal examinations were                            normal.                           The terminal ileum appeared normal.  A single small angiodysplastic lesion without                            bleeding was found in the cecum.                           The exam was otherwise without abnormality on                            direct and retroflexion views.                           Biopsies for histology were taken with a cold                            forceps from the right colon, left colon and rectum                            for evaluation of microscopic colitis. Complications:            No immediate complications. Estimated blood loss:                            Minimal. Estimated Blood Loss:     Estimated blood loss was minimal. Impression:               - The examined portion of the ileum was normal.                           - A single non-bleeding colonic angiodysplastic                            lesion.                           - The examination was otherwise normal on  direct                            and retroflexion views.                           - Biopsies were taken with a cold forceps from the                            right colon, left colon and rectum for evaluation                            of microscopic colitis.                           - Personal history of colonic polyps. 2 diminutive                            adenomas removed 2019 Recommendation:           - Patient has a contact number available for  emergencies. The signs and symptoms of potential                            delayed complications were discussed with the                            patient. Return to normal activities tomorrow.                            Written discharge instructions were provided to the                            patient.                           - Resume previous diet.                           - Continue present medications.                           - Await pathology results.                           - Repeat colonoscopy is recommended. The                            colonoscopy date will be determined after pathology                            results from today's exam become available for                            review.                           - HE IS ON OLMESARTAN - THAT MAY BE CAUSING DIARRHEA                           WILL REVIEW AFTER BIOPSIES RETURN Gatha Mayer, MD 02/11/2019 8:15:31 AM This report has been signed electronically.

## 2019-02-13 ENCOUNTER — Telehealth: Payer: Self-pay | Admitting: *Deleted

## 2019-02-13 ENCOUNTER — Telehealth: Payer: Self-pay

## 2019-02-13 NOTE — Telephone Encounter (Signed)
No answer. Name identifier. Message left to call if any questions or concerns and we would call if later in the day.

## 2019-02-13 NOTE — Telephone Encounter (Signed)
  Follow up Call-  Call back number 02/11/2019  Post procedure Call Back phone  # [phone] ok to talk to wife  Permission to leave phone message Yes  Some recent data might be hidden     Patient questions:  Do you have a fever, pain , or abdominal swelling? No. Pain Score  0 *  Have you tolerated food without any problems? Yes.    Have you been able to return to your normal activities? Yes.    Do you have any questions about your discharge instructions: Diet   No. Medications  No. Follow up visit  No.  Do you have questions or concerns about your Care? No.  Actions: * If pain score is 4 or above: No action needed, pain <4.  1. Have you developed a fever since your procedure? no  2.   Have you had an respiratory symptoms (SOB or cough) since your procedure? no  3.   Have you tested positive for COVID 19 since your procedure no  4.   Have you had any family members/close contacts diagnosed with the COVID 19 since your procedure?  no   If yes to any of these questions please route to Joylene John, RN and Alphonsa Gin, Therapist, sports.

## 2019-02-15 ENCOUNTER — Telehealth: Payer: Self-pay

## 2019-02-15 NOTE — Telephone Encounter (Signed)
Attempted to call pt for COVID-19 screening questions. Left message to call back.

## 2019-02-18 ENCOUNTER — Ambulatory Visit: Payer: BLUE CROSS/BLUE SHIELD | Admitting: Cardiology

## 2019-02-18 ENCOUNTER — Telehealth: Payer: Self-pay | Admitting: Cardiology

## 2019-02-18 NOTE — Telephone Encounter (Signed)
Called to confirm appointment for Dr. Harrell Gave 02-19-19.  LVM

## 2019-02-18 NOTE — Telephone Encounter (Signed)
   Pt aware that he is required to wear a mask and visitors restriction policy. Pt voiced understanding.  COVID-19 Pre-Screening Questions:  . In the past 7 to 10 days have you had a cough,  shortness of breath, headache, congestion, fever (100 or greater) body aches, chills, sore throat, or sudden loss of taste or sense of smell?NO . Have you been around anyone with known Covid 19. NO . Have you been around anyone who is awaiting Covid 19 test results in the past 7 to 10 days?NO . Have you been around anyone who has been exposed to Covid 19, or has mentioned symptoms of Covid 19 within the past 7 to 10 days? NO   If you have any concerns/questions about symptoms patients report during screening (either on the phone or at threshold). Contact the provider seeing the patient or DOD for further guidance.  If neither are available contact a member of the leadership team.

## 2019-02-18 NOTE — Telephone Encounter (Signed)
Patient is returning call.  °

## 2019-02-19 ENCOUNTER — Encounter: Payer: Self-pay | Admitting: Cardiology

## 2019-02-19 ENCOUNTER — Other Ambulatory Visit: Payer: Self-pay

## 2019-02-19 ENCOUNTER — Ambulatory Visit: Payer: BC Managed Care – PPO | Admitting: Cardiology

## 2019-02-19 VITALS — BP 145/76 | HR 86 | Temp 97.5°F | Ht 70.0 in | Wt 141.0 lb

## 2019-02-19 DIAGNOSIS — Z79899 Other long term (current) drug therapy: Secondary | ICD-10-CM

## 2019-02-19 DIAGNOSIS — F17200 Nicotine dependence, unspecified, uncomplicated: Secondary | ICD-10-CM | POA: Diagnosis not present

## 2019-02-19 DIAGNOSIS — I10 Essential (primary) hypertension: Secondary | ICD-10-CM

## 2019-02-19 DIAGNOSIS — I2510 Atherosclerotic heart disease of native coronary artery without angina pectoris: Secondary | ICD-10-CM | POA: Insufficient documentation

## 2019-02-19 DIAGNOSIS — E7800 Pure hypercholesterolemia, unspecified: Secondary | ICD-10-CM | POA: Diagnosis not present

## 2019-02-19 LAB — HEPATIC FUNCTION PANEL
ALT: 19 IU/L (ref 0–44)
AST: 21 IU/L (ref 0–40)
Albumin: 4.3 g/dL (ref 3.8–4.9)
Alkaline Phosphatase: 72 IU/L (ref 39–117)
Bilirubin Total: <0.2 mg/dL (ref 0.0–1.2)
Bilirubin, Direct: 0.05 mg/dL (ref 0.00–0.40)
Total Protein: 6.5 g/dL (ref 6.0–8.5)

## 2019-02-19 LAB — LIPID PANEL
Chol/HDL Ratio: 3.1 ratio (ref 0.0–5.0)
Cholesterol, Total: 121 mg/dL (ref 100–199)
HDL: 39 mg/dL — ABNORMAL LOW (ref >39)
LDL Calculated: 62 mg/dL (ref 0–99)
Triglycerides: 98 mg/dL (ref 0–149)
VLDL Cholesterol Cal: 20 mg/dL (ref 5–40)

## 2019-02-19 MED ORDER — AMLODIPINE BESYLATE 10 MG PO TABS: 10.0000 mg | ORAL_TABLET | Freq: Every day | ORAL | 3 refills | Status: DC

## 2019-02-19 NOTE — Patient Instructions (Signed)
Medication Instructions:  Stop: Olmesartan Amlodipine HCTZ  Increase: Amlodipine 10 mg daily  If you need a refill on your cardiac medications before your next appointment, please call your pharmacy.   Lab work: Your physician recommends that you return for lab work today (LFT, LIPID)  If you have labs (blood work) drawn today and your tests are completely normal, you will receive your results only by: Marland Kitchen MyChart Message (if you have MyChart) OR . A paper copy in the mail If you have any lab test that is abnormal or we need to change your treatment, we will call you to review the results.  Testing/Procedures: None  Follow-Up: At [facility], you and your health needs are our priority.  As part of our continuing mission to provide you with exceptional heart care, we have created designated Provider Care Teams.  These Care Teams include your primary Cardiologist (physician) and Advanced Practice Providers (APPs -  Physician Assistants and Nurse Practitioners) who all work together to provide you with the care you need, when you need it. You will need a follow up appointment in 1 years.  Please call our office 2 months in advance to schedule this appointment.  You may see Buford Dresser, MD or one of the following Advanced Practice Providers on your designated Care Team:   Rosaria Ferries, PA-C . Jory Sims, DNP, ANP

## 2019-02-19 NOTE — Progress Notes (Signed)
Cardiology Office Note:    Date:  02/19/2019   ID:  Kyle Stafford, DOB Jun 10, 1961, MRN [id number]  PCP:  Cassandria Anger, MD  Cardiologist:  Buford Dresser, MD PhD  Referring MD: Cassandria Anger, MD   CC: follow up  History of Present Illness:    Kyle Stafford is a 58 y.o. male with a hx of hypertension, hyperlipidemia, tobacco abuse, CAD detected by coronary calcium score.  Cardiac history: He had a CT cardiac score on 10/16/18 which showed LM/3V calcification with a score of 1416 (99th percentile). His most recent lipids from 09/25/18 noted Tchol 160, TG 133, HDL 34, LDL 99, ratio of 5. This was prior to being started on atorvastatin 10 mg. Risk factors include continued tobacco use. Adopted, no known FH.   No new concerns today. BP usually well controlled, wearing mask today he thinks has raised it. He does not have a BP cuff at home but plans to get one. Prior Bps have been well controlled, including within the last ~1 week (106/65-117/65) per the chart. He has several discrepancies in his med list. His combo pill is listed as losartan 100-HCTZ 12.5 mg in his medical information (not triple pill). Takes amlodipine 5 mg in addition to this. He has had constant issues with diarrhea, wonders if it is his medication. Reviewed that both amlodipine and losartan-HCTZ can be associated with GI issues. We reviewed plans for this, below.  Due for lipids/LFTs today. He tried to get prior to the visit but the order wasn't accessible.   Doesn't want to talk about smoking as it cost him $35 last time. No changes in his usage.   Denies chest pain, shortness of breath at rest or with normal exertion. No PND, orthopnea, LE edema or unexpected weight gain. No syncope or palpitations.  Past Medical History:  Diagnosis Date  . Hypertension   . Personal history of colonic adenomas 07/05/2013    Past Surgical History:  Procedure Laterality Date  . EYE SURGERY     as a toddler  .  knee injury     piece of steel removed    Current Medications: Current Outpatient Medications on File Prior to Visit  Medication Sig  . aspirin EC 81 MG tablet Take 81 mg by mouth daily.  Marland Kitchen atorvastatin (LIPITOR) 40 MG tablet Take 1 tablet (40 mg total) by mouth daily.  Marland Kitchen b complex vitamins tablet Take 1 tablet by mouth daily.  . Cholecalciferol (VITAMIN D3) 2000 units capsule Take 1 capsule (2,000 Units total) by mouth daily.  . Melatonin-Pyridoxine (MELATIN PO) Take by mouth as needed.   No current facility-administered medications on file prior to visit.     He isn't sure what his current combo pill is but he thinks it was switched.  Allergies:   Bupropion hcl and Penicillins   Social History   Socioeconomic History  . Marital status: Married    Spouse name: Not on file  . Number of children: Not on file  . Years of education: Not on file  . Highest education level: Not on file  Occupational History  . Not on file  Social Needs  . Financial resource strain: Not on file  . Food insecurity    Worry: Not on file    Inability: Not on file  . Transportation needs    Medical: Not on file    Non-medical: Not on file  Tobacco Use  . Smoking status: Current Every Day Smoker  Packs/day: 1.50    Types: Cigarettes  . Smokeless tobacco: Never Used  Substance and Sexual Activity  . Alcohol use: Yes    Alcohol/week: 21.0 standard drinks    Types: 21 Cans of beer per week    Comment: 2 nights a week  . Drug use: No  . Sexual activity: Yes  Lifestyle  . Physical activity    Days per week: Not on file    Minutes per session: Not on file  . Stress: Not on file  Relationships  . Social Herbalist on phone: Not on file    Gets together: Not on file    Attends religious service: Not on file    Active member of club or organization: Not on file    Attends meetings of clubs or organizations: Not on file    Relationship status: Not on file  Other Topics Concern  .  Not on file  Social History Narrative  . Not on file     Family History: The patient is adopted and does not know his biological family history.  ROS:   Please see the history of present illness.  Additional pertinent ROS:  Constitutional: Negative for chills, fever, night sweats, unintentional weight loss (has stabilized) HENT: Negative for ear pain and hearing loss.   Eyes: Negative for loss of vision and eye pain.  Respiratory: Negative for cough, sputum, wheezing.   Cardiovascular: See HPI. Gastrointestinal: Negative for abdominal pain, melena, and hematochezia. Positive for diarrhea. Genitourinary: Negative for dysuria and hematuria.  Musculoskeletal: Negative for falls and myalgias.  Skin: Negative for itching and rash.  Neurological: Negative for focal weakness, focal sensory changes and loss of consciousness.  Endo/Heme/Allergies: Does not bruise/bleed easily.    EKGs/Labs/Other Studies Reviewed:    The following studies were reviewed today: Lipids, CT cardiac score  EKG:  EKG is personally reviewed.  The ekg ordered 11/06/18 demonstrates NSR, RAE  Recent Labs: 09/25/2018: ALT 12; BUN 6; Creatinine, Ser 0.79; Hemoglobin 16.0; Platelets 233.0; Potassium 3.5; Sodium 140; TSH 1.20  Recent Lipid Panel    Component Value Date/Time   CHOL 160 09/25/2018 0957   TRIG 133.0 09/25/2018 0957   HDL 34.50 (L) 09/25/2018 0957   CHOLHDL 5 09/25/2018 0957   VLDL 26.6 09/25/2018 0957   LDLCALC 99 09/25/2018 0957   LDLDIRECT 104.1 05/15/2013 1301    Physical Exam:    VS:  BP (!) 145/76   Pulse 86   Temp (!) 97.5 F (36.4 C)   Ht 5\' 10"  (1.778 m)   Wt 141 lb (64 kg)   BMI 20.23 kg/m     Wt Readings from Last 3 Encounters:  02/19/19 141 lb (64 kg)  02/11/19 140 lb (63.5 kg)  11/19/18 141 lb 3.2 oz (64 kg)     GEN: Well nourished, well developed in no acute distress HEENT: Normal NECK: No JVD; No carotid bruits LYMPHATICS: No lymphadenopathy CARDIAC: regular rhythm,  normal S1 and S2, no murmurs, rubs, gallops. Radial and DP pulses 2+ bilaterally. RESPIRATORY:  Clear to auscultation without rales, wheezing or rhonchi  ABDOMEN: Soft, non-tender, non-distended MUSCULOSKELETAL:  No edema; No deformity  SKIN: Warm and dry NEUROLOGIC:  Alert and oriented x 3 PSYCHIATRIC:  Normal affect   ASSESSMENT:    1. Essential hypertension   2. TOBACCO USE DISORDER/SMOKER-SMOKING CESSATION DISCUSSED   3. Coronary artery disease involving native coronary artery of native heart without angina pectoris   4. Pure hypercholesterolemia   5.  Medication management    PLAN:    CAD based on coronary calcium score: see prior note [id number] on additional testing, red flag symptoms. -he is tolerating atorvastatin 40 mg. Reviewed goal of LDL <70 -discussed importance of tobacco cessation, below -on aspirin 81 mg  Tobacco abuse: The patient was counseled on tobacco cessation again today. However, he was upset that he was charged for it at the last visit. He has no changes to his smoking and does not plan to quit.  Hypertension: above goal today, but within the last week has had well controlled BP -with GI symptoms, discussed that this could be either the amlodpine or the losartan-HCTZ. As he feels dehydrated, he wishes to stop the losartan-HCTZ and trial increasing the amlodipine to see if his BP remains controlled and his diarrhea improves -he plans to get BP cuff and measure Bps. Will call us if his BP consistently >130/80 at home -he wishes to follow up with Dr. Alain Marion for hypertension management. We would be happy to assist if needed.  Hyperlipidemia: hypercholesterolemia, with LDL goal <70 -unknown family history. -tolerating 40 mg atorvastatin -recheck lipids and LFTS today  Cardiac risk counseling and prevention recommendations: -recommend heart healthy/Mediterranean diet, with whole grains, fruits, vegetable, fish, lean meats, nuts, and olive oil. Limit salt.  -recommend moderate walking, 3-5 times/week for 30-50 minutes each session. Aim for at least 150 minutes.week. Goal should be pace of 3 miles/hours, or walking 1.5 miles in 30 minutes -recommend avoidance of tobacco products. Avoid excess alcohol.  Plan for follow up: 1 year or sooner PRN  Medication Adjustments/Labs and Tests Ordered: Current medicines are reviewed at length with the patient today.  Concerns regarding medicines are outlined above.  No orders of the defined types were placed in this encounter.  Meds ordered this encounter  Medications  . amLODipine (NORVASC) 10 MG tablet    Sig: Take 1 tablet (10 mg total) by mouth daily.    Dispense:  90 tablet    Refill:  3    Please wait to fill until patient calls    Patient Instructions  Medication Instructions:  Stop: Olmesartan Amlodipine HCTZ  Increase: Amlodipine 10 mg daily  If you need a refill on your cardiac medications before your next appointment, please call your pharmacy.   Lab work: Your physician recommends that you return for lab work today (LFT, LIPID)  If you have labs (blood work) drawn today and your tests are completely normal, you will receive your results only by: Marland Kitchen MyChart Message (if you have MyChart) OR . A paper copy in the mail If you have any lab test that is abnormal or we need to change your treatment, we will call you to review the results.  Testing/Procedures: None  Follow-Up: At [facility], you and your health needs are our priority.  As part of our continuing mission to provide you with exceptional heart care, we have created designated Provider Care Teams.  These Care Teams include your primary Cardiologist (physician) and Advanced Practice Providers (APPs -  Physician Assistants and Nurse Practitioners) who all work together to provide you with the care you need, when you need it. You will need a follow up appointment in 1 years.  Please call our office 2 months in advance to  schedule this appointment.  You may see Buford Dresser, MD or one of the following Advanced Practice Providers on your designated Care Team:   Rosaria Ferries, PA-C . Jory Sims, DNP, ANP  Signed, Buford Dresser, MD PhD 02/19/2019 2:15 PM    [facility]

## 2019-02-20 ENCOUNTER — Encounter: Payer: Self-pay | Admitting: Internal Medicine

## 2019-02-20 DIAGNOSIS — K52832 Lymphocytic colitis: Secondary | ICD-10-CM

## 2019-02-20 HISTORY — DX: Lymphocytic colitis: K52.832

## 2019-02-20 NOTE — Progress Notes (Signed)
[facility] no letter - recall 10 yrs  Office - contact patient and explain he has inflammation in the colon Lymphocytic colitis - I think his BP med (with olmesartan) may have caused this. I see cardiology stopped it today.  Ask him to give that a couple of weeks and to let me know if diarrhea stops.  CEG

## 2019-05-28 ENCOUNTER — Other Ambulatory Visit: Payer: Self-pay

## 2019-05-28 DIAGNOSIS — I2510 Atherosclerotic heart disease of native coronary artery without angina pectoris: Secondary | ICD-10-CM

## 2019-05-28 MED ORDER — AMLODIPINE BESYLATE 10 MG PO TABS: 10.0000 mg | ORAL_TABLET | Freq: Every day | ORAL | 3 refills | Status: DC

## 2019-05-28 MED ORDER — ATORVASTATIN CALCIUM 40 MG PO TABS: 40.0000 mg | ORAL_TABLET | Freq: Every day | ORAL | 3 refills | Status: DC

## 2019-05-29 ENCOUNTER — Other Ambulatory Visit: Payer: Self-pay

## 2019-05-29 DIAGNOSIS — I2510 Atherosclerotic heart disease of native coronary artery without angina pectoris: Secondary | ICD-10-CM

## 2019-05-29 DIAGNOSIS — I2584 Coronary atherosclerosis due to calcified coronary lesion: Secondary | ICD-10-CM

## 2019-05-29 MED ORDER — AMLODIPINE BESYLATE 10 MG PO TABS: 10.0000 mg | ORAL_TABLET | Freq: Every day | ORAL | 3 refills | Status: DC

## 2019-05-29 MED ORDER — ATORVASTATIN CALCIUM 40 MG PO TABS: 40.0000 mg | ORAL_TABLET | Freq: Every day | ORAL | 3 refills | Status: DC

## 2020-04-10 ENCOUNTER — Ambulatory Visit: Payer: BC Managed Care – PPO | Admitting: Cardiology

## 2020-05-08 ENCOUNTER — Encounter: Payer: Self-pay | Admitting: Cardiology

## 2020-05-08 ENCOUNTER — Ambulatory Visit: Payer: BC Managed Care – PPO | Admitting: Cardiology

## 2020-05-08 ENCOUNTER — Other Ambulatory Visit: Payer: Self-pay

## 2020-05-08 VITALS — BP 154/78 | HR 55 | Ht 70.0 in | Wt 137.8 lb

## 2020-05-08 DIAGNOSIS — I10 Essential (primary) hypertension: Secondary | ICD-10-CM | POA: Diagnosis not present

## 2020-05-08 DIAGNOSIS — I2510 Atherosclerotic heart disease of native coronary artery without angina pectoris: Secondary | ICD-10-CM

## 2020-05-08 DIAGNOSIS — E7800 Pure hypercholesterolemia, unspecified: Secondary | ICD-10-CM | POA: Diagnosis not present

## 2020-05-08 DIAGNOSIS — F17200 Nicotine dependence, unspecified, uncomplicated: Secondary | ICD-10-CM | POA: Diagnosis not present

## 2020-05-08 DIAGNOSIS — Z7189 Other specified counseling: Secondary | ICD-10-CM

## 2020-05-08 MED ORDER — LOSARTAN POTASSIUM-HCTZ 100-12.5 MG PO TABS: 1.0000 | ORAL_TABLET | Freq: Every day | ORAL | 3 refills | Status: DC

## 2020-05-08 NOTE — Progress Notes (Signed)
Cardiology Office Note:    Date:  05/08/2020   ID:  Kyle Stafford, DOB 05-05-1961, MRN [id number]  PCP:  Kyle Anger, MD  Cardiologist:  Kyle Dresser, MD PhD  Referring MD: Kyle Anger, MD   CC: follow up  History of Present Illness:    Kyle Stafford is a 59 y.o. male with a hx of hypertension, hyperlipidemia, tobacco abuse, CAD detected by coronary calcium score.  Cardiac history: He had a CT cardiac score on 10/16/18 which showed LM/3V calcification with a score of 1416 (99th percentile). His most recent lipids from 09/25/18 noted Tchol 160, TG 133, HDL 34, LDL 99, ratio of 5. This was prior to being started on atorvastatin 10 mg. Risk factors include continued tobacco use. Adopted, no known FH.   Today: No chest pain. Breathing is short with certain activities. Notices that swinging a hammer, he can do about 10 strikes and then has to stop. No issues with climbing stairs, though he doesn't do this regularly. Walk 6 miles/day for his job without issues. No syncope.  Tolerating medications. Felt like he was doing better on losartan-HCTZ than he does on amlodipine alone. Diarrhea unchanged. Would like to try to go back. Per the records, losartan-HCTZ was changed to olmesartan-amlodipine-hctz due to backorder. May need to drop back amlodipine dose with this, he will call me if BP low. He plans to get arm cuff, reviewed today.  Asking for a prescription for Xanax, as he using his wife's to help sleep. Due for follow up with Dr. Alain Stafford  Tolerating atorvastatin, reviewed lipid numbers/goals.   Still smoking, not interested in quitting.  Discussed Covid vaccination today as well, recommended.    Past Medical History:  Diagnosis Date  . Hypertension   . Lymphocytic colitis 02/20/2019  . Personal history of colonic adenomas 07/05/2013    Past Surgical History:  Procedure Laterality Date  . EYE SURGERY     as a toddler  . knee injury     piece of  steel removed    Current Medications: Current Outpatient Medications on File Prior to Visit  Medication Sig  . amLODipine (NORVASC) 10 MG tablet Take 1 tablet (10 mg total) by mouth daily.  Marland Kitchen aspirin EC 81 MG tablet Take 81 mg by mouth daily.  Marland Kitchen atorvastatin (LIPITOR) 40 MG tablet Take 1 tablet (40 mg total) by mouth daily.  Marland Kitchen b complex vitamins tablet Take 1 tablet by mouth daily.  . Cholecalciferol (VITAMIN D3) 2000 units capsule Take 1 capsule (2,000 Units total) by mouth daily.  . Melatonin-Pyridoxine (MELATIN PO) Take by mouth as needed.   No current facility-administered medications on file prior to visit.    He isn't sure what his current combo pill is but he thinks it was switched.  Allergies:   Bupropion hcl and Penicillins   Social History   Tobacco Use  . Smoking status: Current Every Day Smoker    Packs/day: 1.50    Types: Cigarettes  . Smokeless tobacco: Never Used  Vaping Use  . Vaping Use: Never used  Substance Use Topics  . Alcohol use: Yes    Alcohol/week: 21.0 standard drinks    Types: 21 Cans of beer per week    Comment: 2 nights a week  . Drug use: No     Family History: The patient is adopted and does not know his biological family history.  ROS:   Please see the history of present illness.  Additional pertinent ROS  otherwise unremarkable.   EKGs/Labs/Other Studies Reviewed:    The following studies were reviewed today: Lipids, CT cardiac score  EKG:  EKG is personally reviewed.  The ekg ordered today demonstrates sinus bradycardia at 55 bpm  Recent Labs: No results found for requested labs within last 8760 hours.  Recent Lipid Panel    Component Value Date/Time   CHOL 121 02/19/2019 1013   TRIG 98 02/19/2019 1013   HDL 39 (L) 02/19/2019 1013   CHOLHDL 3.1 02/19/2019 1013   CHOLHDL 5 09/25/2018 0957   VLDL 26.6 09/25/2018 0957   LDLCALC 62 02/19/2019 1013   LDLDIRECT 104.1 05/15/2013 1301    Physical Exam:    VS:  BP (!) 154/78    Pulse (!) 55   Ht 5\' 10"  (1.778 m)   Wt 137 lb 12.8 oz (62.5 kg)   SpO2 96%   BMI 19.77 kg/m     Wt Readings from Last 3 Encounters:  05/08/20 137 lb 12.8 oz (62.5 kg)  02/19/19 141 lb (64 kg)  02/11/19 140 lb (63.5 kg)    GEN: Well nourished, well developed in no acute distress HEENT: Normal, moist mucous membranes NECK: No JVD CARDIAC: regular rhythm, normal S1 and S2, no rubs or gallops. No murmur. VASCULAR: Radial and DP pulses 2+ bilaterally. No carotid bruits RESPIRATORY:  Clear to auscultation without rales, wheezing or rhonchi  ABDOMEN: Soft, non-tender, non-distended MUSCULOSKELETAL:  Ambulates independently SKIN: Warm and dry, no edema NEUROLOGIC:  Alert and oriented x 3. No focal neuro deficits noted. PSYCHIATRIC:  Normal affect   ASSESSMENT:    1. Essential hypertension   2. Coronary artery disease involving native coronary artery of native heart without angina pectoris   3. Pure hypercholesterolemia   4. TOBACCO USE DISORDER/SMOKER-SMOKING CESSATION DISCUSSED   5. Cardiac risk counseling   6. Counseling on health promotion and disease prevention    PLAN:    CAD based on coronary calcium score: see prior note [id number] on additional testing, red flag symptoms. -he is tolerating atorvastatin 40 mg. Reviewed goal of LDL <70 -discussed importance of tobacco cessation. He is not interested in quitting at this time -on aspirin 81 mg  Hypertension: above goal today -no change in his diarrhea with attempting trial of stopping losartan-HCTZ and increasing amlodipine -continue amlodipine 10 mg daily -restart losartan-HCTZ today -he will monitor home BP numbers and call if not improved. He is going to look for an Omron arm cuff  Hyperlipidemia: hypercholesterolemia, with LDL goal <70 -unknown family history. -tolerating 40 mg atorvastatin -last LDL 62, at goal  Cardiac risk counseling and prevention recommendations: -recommend heart healthy/Mediterranean  diet, with whole grains, fruits, vegetable, fish, lean meats, nuts, and olive oil. Limit salt. -recommend moderate walking, 3-5 times/week for 30-50 minutes each session. Aim for at least 150 minutes.week. Goal should be pace of 3 miles/hours, or walking 1.5 miles in 30 minutes -recommend avoidance of tobacco products. Avoid excess alcohol.  Plan for follow up: 6 mos or sooner PRN  Medication Adjustments/Labs and Tests Ordered: Current medicines are reviewed at length with the patient today.  Concerns regarding medicines are outlined above.  Orders Placed This Encounter  Procedures  . EKG 12-Lead   Meds ordered this encounter  Medications  . losartan-hydrochlorothiazide (HYZAAR) 100-12.5 MG tablet    Sig: Take 1 tablet by mouth daily.    Dispense:  90 tablet    Refill:  3    Patient Instructions  Medication Instructions:  Restart Losartan- Hydrochlorothiazide 100-12.5  mg daily  *If you need a refill on your cardiac medications before your next appointment, please call your pharmacy*   Lab Work: None ordered   Testing/Procedures: None ordered    Follow-Up: At [facility], you and your health needs are our priority.  As part of our continuing mission to provide you with exceptional heart care, we have created designated Provider Care Teams.  These Care Teams include your primary Cardiologist (physician) and Advanced Practice Providers (APPs -  Physician Assistants and Nurse Practitioners) who all work together to provide you with the care you need, when you need it.  We recommend signing up for the patient portal called "MyChart".  Sign up information is provided on this After Visit Summary.  MyChart is used to connect with patients for Virtual Visits (Telemedicine).  Patients are able to view lab/test results, encounter notes, upcoming appointments, etc.  Non-urgent messages can be sent to your provider as well.   To learn more about what you can do with MyChart, go to  [email or web address].    Your next appointment:   6 month(s)  The format for your next appointment:   In Person  Provider:   Buford Dresser, MD   Other Instructions Look for Omron arm blood pressure cuff.     Signed, Kyle Dresser, MD PhD 05/08/2020  [place]

## 2020-05-08 NOTE — Patient Instructions (Addendum)
Medication Instructions:  Restart Losartan- Hydrochlorothiazide 100-12.5 mg daily  *If you need a refill on your cardiac medications before your next appointment, please call your pharmacy*   Lab Work: None ordered   Testing/Procedures: None ordered    Follow-Up: At [facility], you and your health needs are our priority.  As part of our continuing mission to provide you with exceptional heart care, we have created designated Provider Care Teams.  These Care Teams include your primary Cardiologist (physician) and Advanced Practice Providers (APPs -  Physician Assistants and Nurse Practitioners) who all work together to provide you with the care you need, when you need it.  We recommend signing up for the patient portal called "MyChart".  Sign up information is provided on this After Visit Summary.  MyChart is used to connect with patients for Virtual Visits (Telemedicine).  Patients are able to view lab/test results, encounter notes, upcoming appointments, etc.  Non-urgent messages can be sent to your provider as well.   To learn more about what you can do with MyChart, go to [email or web address].    Your next appointment:   6 month(s)  The format for your next appointment:   In Person  Provider:   Buford Dresser, MD   Other Instructions Look for Omron arm blood pressure cuff.

## 2020-06-29 ENCOUNTER — Encounter: Payer: Self-pay | Admitting: Cardiology

## 2020-06-30 ENCOUNTER — Other Ambulatory Visit: Payer: Self-pay | Admitting: Cardiology

## 2020-06-30 DIAGNOSIS — I2510 Atherosclerotic heart disease of native coronary artery without angina pectoris: Secondary | ICD-10-CM

## 2020-10-12 ENCOUNTER — Encounter: Payer: Self-pay | Admitting: Internal Medicine

## 2020-10-12 ENCOUNTER — Ambulatory Visit (INDEPENDENT_AMBULATORY_CARE_PROVIDER_SITE_OTHER): Payer: BC Managed Care – PPO | Admitting: Internal Medicine

## 2020-10-12 ENCOUNTER — Other Ambulatory Visit: Payer: Self-pay

## 2020-10-12 VITALS — BP 162/82 | HR 69 | Temp 98.5°F | Ht 70.0 in | Wt 135.0 lb

## 2020-10-12 DIAGNOSIS — G4700 Insomnia, unspecified: Secondary | ICD-10-CM

## 2020-10-12 DIAGNOSIS — Z0000 Encounter for general adult medical examination without abnormal findings: Secondary | ICD-10-CM | POA: Diagnosis not present

## 2020-10-12 DIAGNOSIS — E785 Hyperlipidemia, unspecified: Secondary | ICD-10-CM | POA: Insufficient documentation

## 2020-10-12 DIAGNOSIS — I2584 Coronary atherosclerosis due to calcified coronary lesion: Secondary | ICD-10-CM

## 2020-10-12 DIAGNOSIS — I10 Essential (primary) hypertension: Secondary | ICD-10-CM | POA: Diagnosis not present

## 2020-10-12 DIAGNOSIS — R5383 Other fatigue: Secondary | ICD-10-CM

## 2020-10-12 DIAGNOSIS — J320 Chronic maxillary sinusitis: Secondary | ICD-10-CM

## 2020-10-12 DIAGNOSIS — F1020 Alcohol dependence, uncomplicated: Secondary | ICD-10-CM

## 2020-10-12 DIAGNOSIS — J329 Chronic sinusitis, unspecified: Secondary | ICD-10-CM | POA: Insufficient documentation

## 2020-10-12 DIAGNOSIS — I2510 Atherosclerotic heart disease of native coronary artery without angina pectoris: Secondary | ICD-10-CM

## 2020-10-12 LAB — COMPREHENSIVE METABOLIC PANEL
ALT: 15 U/L (ref 0–53)
AST: 17 U/L (ref 0–37)
Albumin: 4.6 g/dL (ref 3.5–5.2)
Alkaline Phosphatase: 61 U/L (ref 39–117)
BUN: 7 mg/dL (ref 6–23)
CO2: 31 mEq/L (ref 19–32)
Calcium: 9.9 mg/dL (ref 8.4–10.5)
Chloride: 95 mEq/L — ABNORMAL LOW (ref 96–112)
Creatinine, Ser: 0.88 mg/dL (ref 0.40–1.50)
GFR: 95.25 mL/min (ref >60.00)
Glucose, Bld: 97 mg/dL (ref 70–99)
Potassium: 4.2 mEq/L (ref 3.5–5.1)
Sodium: 133 mEq/L — ABNORMAL LOW (ref 135–145)
Total Bilirubin: 0.5 mg/dL (ref 0.2–1.2)
Total Protein: 7.4 g/dL (ref 6.0–8.3)

## 2020-10-12 LAB — LIPID PANEL
Cholesterol: 125 mg/dL (ref 0–200)
HDL: 44.2 mg/dL (ref >39.00)
LDL Cholesterol: 65 mg/dL (ref 0–99)
NonHDL: 81
Total CHOL/HDL Ratio: 3
Triglycerides: 78 mg/dL (ref 0.0–149.0)
VLDL: 15.6 mg/dL (ref 0.0–40.0)

## 2020-10-12 LAB — CBC WITH DIFFERENTIAL/PLATELET
Basophils Absolute: 0 10*3/uL (ref 0.0–0.1)
Basophils Relative: 0.3 % (ref 0.0–3.0)
Eosinophils Absolute: 0.1 10*3/uL (ref 0.0–0.7)
Eosinophils Relative: 1.3 % (ref 0.0–5.0)
HCT: 48 % (ref 39.0–52.0)
Hemoglobin: 16.5 g/dL (ref 13.0–17.0)
Lymphocytes Relative: 26.3 % (ref 12.0–46.0)
Lymphs Abs: 1.7 10*3/uL (ref 0.7–4.0)
MCHC: 34.3 g/dL (ref 30.0–36.0)
MCV: 96.6 fl (ref 78.0–100.0)
Monocytes Absolute: 0.5 10*3/uL (ref 0.1–1.0)
Monocytes Relative: 8 % (ref 3.0–12.0)
Neutro Abs: 4.2 10*3/uL (ref 1.4–7.7)
Neutrophils Relative %: 64.1 % (ref 43.0–77.0)
Platelets: 215 10*3/uL (ref 150.0–400.0)
RBC: 4.97 Mil/uL (ref 4.22–5.81)
RDW: 12.6 % (ref 11.5–15.5)
WBC: 6.6 10*3/uL (ref 4.0–10.5)

## 2020-10-12 LAB — PSA: PSA: 0.95 ng/mL (ref 0.10–4.00)

## 2020-10-12 LAB — TESTOSTERONE: Testosterone: 311.9 ng/dL (ref 300.00–890.00)

## 2020-10-12 LAB — TSH: TSH: 1.46 u[IU]/mL (ref 0.35–4.50)

## 2020-10-12 MED ORDER — ALPRAZOLAM 0.5 MG PO TBDP: 0.5000 mg | ORAL_TABLET | Freq: Two times a day (BID) | ORAL | 3 refills | Status: DC | PRN

## 2020-10-12 MED ORDER — METHYLPREDNISOLONE 4 MG PO TBPK: ORAL_TABLET | ORAL | 0 refills | Status: DC

## 2020-10-12 MED ORDER — ATORVASTATIN CALCIUM 40 MG PO TABS: 40.0000 mg | ORAL_TABLET | Freq: Every day | ORAL | 3 refills | Status: DC

## 2020-10-12 MED ORDER — LEVOFLOXACIN 500 MG PO TABS: 500.0000 mg | ORAL_TABLET | Freq: Every day | ORAL | 0 refills | Status: DC

## 2020-10-12 MED ORDER — FLUTICASONE PROPIONATE 50 MCG/ACT NA SUSP: 2.0000 | Freq: Every day | NASAL | 6 refills | Status: DC

## 2020-10-12 MED ORDER — LOSARTAN POTASSIUM-HCTZ 100-12.5 MG PO TABS: 1.0000 | ORAL_TABLET | Freq: Every day | ORAL | 3 refills | Status: DC

## 2020-10-12 MED ORDER — AMLODIPINE BESYLATE 10 MG PO TABS: 10.0000 mg | ORAL_TABLET | Freq: Every day | ORAL | 3 refills | Status: DC

## 2020-10-12 NOTE — Assessment & Plan Note (Signed)
On ASA, Lipitor 2 ppd smoker - needs to cut back and stop

## 2020-10-12 NOTE — Progress Notes (Signed)
Subjective:  Patient ID: Kyle Stafford, male    DOB: [birth year]  Age: 60 y.o. MRN: [id number]  CC: Annual Exam   HPI [name] presents for a well exam C/o anxiety, insomnia - shift work Smokes 2 ppd   Outpatient Medications Prior to Visit  Medication Sig Dispense Refill  . aspirin EC 81 MG tablet Take 81 mg by mouth daily.    Marland Kitchen atorvastatin (LIPITOR) 40 MG tablet TAKE 1 TABLET DAILY 90 tablet 3  . b complex vitamins tablet Take 1 tablet by mouth daily. 100 tablet 3  . Cholecalciferol (VITAMIN D3) 2000 units capsule Take 1 capsule (2,000 Units total) by mouth daily. 100 capsule 3  . losartan-hydrochlorothiazide (HYZAAR) 100-12.5 MG tablet Take 1 tablet by mouth daily. 90 tablet 3  . Melatonin-Pyridoxine (MELATIN PO) Take by mouth as needed.    Marland Kitchen amLODipine (NORVASC) 10 MG tablet Take 1 tablet (10 mg total) by mouth daily. (Patient not taking: Reported on 10/12/2020) 90 tablet 3   No facility-administered medications prior to visit.    ROS: Review of Systems  Constitutional: Negative for appetite change, fatigue and unexpected weight change.  HENT: Negative for congestion, nosebleeds, sneezing, sore throat and trouble swallowing.   Eyes: Negative for itching and visual disturbance.  Respiratory: Positive for cough.   Cardiovascular: Negative for chest pain, palpitations and leg swelling.  Gastrointestinal: Negative for abdominal distention, blood in stool, diarrhea and nausea.  Genitourinary: Negative for frequency and hematuria.  Musculoskeletal: Negative for back pain, gait problem, joint swelling and neck pain.  Skin: Negative for rash.  Neurological: Negative for dizziness, tremors, speech difficulty and weakness.  Psychiatric/Behavioral: Negative for agitation, dysphoric mood and sleep disturbance. The patient is not nervous/anxious.     Objective:  BP (!) 162/82 (BP Location: Left Arm)   Pulse 69   Temp 98.5 F (36.9 C) (Oral)   Ht 5\' 10"  (1.778 m)   Wt 135  lb (61.2 kg)   SpO2 96%   BMI 19.37 kg/m   BP Readings from Last 3 Encounters:  10/12/20 (!) 162/82  05/08/20 (!) 154/78  02/19/19 (!) 145/76    Wt Readings from Last 3 Encounters:  10/12/20 135 lb (61.2 kg)  05/08/20 137 lb 12.8 oz (62.5 kg)  02/19/19 141 lb (64 kg)    Physical Exam Constitutional:      General: He is not in acute distress.    Appearance: Normal appearance. He is well-developed.     Comments: NAD  HENT:     Mouth/Throat:     Mouth: Oropharynx is clear and moist.  Eyes:     Conjunctiva/sclera: Conjunctivae normal.     Pupils: Pupils are equal, round, and reactive to light.  Neck:     Thyroid: No thyromegaly.     Vascular: No JVD.  Cardiovascular:     Rate and Rhythm: Normal rate and regular rhythm.     Pulses: Intact distal pulses.     Heart sounds: Normal heart sounds. No murmur heard. No friction rub. No gallop.   Pulmonary:     Effort: Pulmonary effort is normal. No respiratory distress.     Breath sounds: Normal breath sounds. No wheezing or rales.  Chest:     Chest wall: No tenderness.  Abdominal:     General: Bowel sounds are normal. There is no distension.     Palpations: Abdomen is soft. There is no mass.     Tenderness: There is no abdominal tenderness. There is  no guarding or rebound.  Musculoskeletal:        General: No tenderness or edema. Normal range of motion.     Cervical back: Normal range of motion.  Lymphadenopathy:     Cervical: No cervical adenopathy.  Skin:    General: Skin is warm and dry.     Findings: No rash.  Neurological:     Mental Status: He is alert and oriented to person, place, and time.     Cranial Nerves: No cranial nerve deficit.     Motor: No abnormal muscle tone.     Coordination: He displays a negative Romberg sign. Coordination normal.     Gait: Gait normal.     Deep Tendon Reflexes: Reflexes are normal and symmetric.  Psychiatric:        Mood and Affect: Mood and affect normal.        Behavior:  Behavior normal.        Thought Content: Thought content normal.        Judgment: Judgment normal.    Pt refused rectal exam   I spent 22 minutes in addition to time for CPX wellness examination in preparing to see the patient by review of recent labs, imaging and procedures, obtaining and reviewing separately obtained history, communicating with the patient, ordering medications, tests or procedures, and documenting clinical information in the EHR including the differential diagnosis, treatment, and any further evaluation and other management of fatigue, sinusitis, insomnia.        Lab Results  Component Value Date   WBC 6.0 09/25/2018   HGB 16.0 09/25/2018   HCT 46.3 09/25/2018   PLT 233.0 09/25/2018   GLUCOSE 93 09/25/2018   CHOL 121 02/19/2019   TRIG 98 02/19/2019   HDL 39 (L) 02/19/2019   LDLDIRECT 104.1 05/15/2013   LDLCALC 62 02/19/2019   ALT 19 02/19/2019   AST 21 02/19/2019   NA 140 09/25/2018   K 3.5 09/25/2018   CL 101 09/25/2018   CREATININE 0.79 09/25/2018   BUN 6 09/25/2018   CO2 32 09/25/2018   TSH 1.20 09/25/2018   PSA 1.09 09/25/2018    CT CARDIAC SCORING  Addendum Date: 10/16/2018   ADDENDUM REPORT: 10/16/2018 15:27 CLINICAL DATA:  Risk stratification EXAM: Coronary Calcium Score TECHNIQUE: The patient was scanned on a Siemens Somatom 64 slice scanner. Axial non-contrast 3 mm slices were carried out through the heart. The data set was analyzed on a dedicated work station and scored using the [place]. FINDINGS: Non-cardiac: See separate report from [facility]. Ascending aorta: Normal diameter 2.9 cm Pericardium: Normal Coronary arteries: Marked LM and 3 vessel coronary calcium IMPRESSION: Coronary calcium score of 1416. This was 20 th percentile for age and sex matched control. Given the extent of calcium consider f/u perfusion study See radiology note regarding pulmonary findings Jenkins Rouge Electronically Signed   By: Jenkins Rouge M.D.    On: 10/16/2018 15:27   Result Date: 10/16/2018 EXAM: OVER-READ INTERPRETATION  CT CHEST The following report is an over-read performed by radiologist Dr. Aletta Edouard of [facility], [place] on 10/16/2018. This over-read does not include interpretation of cardiac or coronary anatomy or pathology. The coronary calcium score interpretation by the cardiologist is attached. COMPARISON:  None. FINDINGS: Vascular: No incidental findings. Mediastinum/Nodes: No enlarged lymph nodes identified. 8 mm nonenlarged precarinal lymph node identified. Lungs/Pleura: Nodule within the superior segment of the left lower lobe is best delineated on the coronal and sagittal reconstructions and measures approximately 6  x 9 x 4 mm in greatest dimensions. This nodule appears noncalcified. There is an additional 4 mm rounded noncalcified nodule in the lateral left lower lobe. 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 Upper Abdomen: No acute abnormality. Musculoskeletal: No chest wall mass or suspicious bone lesions identified. IMPRESSION: 6 x 9 x 4 mm nodule in the superior segment of the left lower lobe and 4 mm nodule in the lateral left lower lobe. 8 mm nonenlarged precarinal lymph node identified. As the entire chest was not imaged on the calcium score study, an initial CT of the chest with IV contrast is recommended. This will better help to delineate follow-up recommendations or further imaging recommendations depending on detection of other nodules or lymphadenopathy. Electronically Signed: By: Aletta Edouard M.D. On: 10/16/2018 14:58    Assessment & Plan:    Walker Kehr, MD

## 2020-10-12 NOTE — Assessment & Plan Note (Signed)
On Liptor

## 2020-10-12 NOTE — Assessment & Plan Note (Signed)
Levaquin Medrol pack

## 2020-10-12 NOTE — Assessment & Plan Note (Signed)
  We discussed age appropriate health related issues, including available/recomended screening tests and vaccinations. Labs were ordered to be later reviewed . All questions were answered. We discussed one or more of the following - seat belt use, use of sunscreen/sun exposure exercise, safe sex, fall risk reduction, second hand smoke exposure, firearm use and storage, seat belt use, a need for adhering to healthy diet and exercise. Labs were ordered.  All questions were answered. On ASA, Lipitor 2 ppd smoker - needs to cut back and stop

## 2020-10-12 NOTE — Assessment & Plan Note (Signed)
Xanax prn  Potential benefits of a long term benzodiazepines  use as well as potential risks  and complications were explained to the patient and were aknowledged. 

## 2020-10-12 NOTE — Assessment & Plan Note (Signed)
Discussed -- 6 beers on Sat and 6 on Sun

## 2020-10-13 ENCOUNTER — Other Ambulatory Visit: Payer: BC Managed Care – PPO

## 2020-10-13 LAB — URINALYSIS
Bilirubin Urine: NEGATIVE
Hgb urine dipstick: NEGATIVE
Ketones, ur: NEGATIVE
Leukocytes,Ua: NEGATIVE
Nitrite: NEGATIVE
Specific Gravity, Urine: 1.025 (ref 1.000–1.030)
Total Protein, Urine: NEGATIVE
Urine Glucose: NEGATIVE
Urobilinogen, UA: 0.2 (ref 0.0–1.0)
pH: 6 (ref 5.0–8.0)

## 2020-10-30 IMAGING — CT CT HEART SCORING
2 series · 16 of 20 positions shown, 18 images · non-contrast
Comparison: None.

Addendum:
EXAM:
OVER-READ INTERPRETATION  CT CHEST

The following report is an over-read performed by radiologist Dr.
Chai Tiger [REDACTED] on 10/16/2018. This
over-read does not include interpretation of cardiac or coronary
anatomy or pathology. The coronary calcium score interpretation by
the cardiologist is attached.
CLINICAL DATA: Risk stratification
Coronary Calcium Score
TECHNIQUE: The patient was scanned on a Siemens Somatom 64 slice scanner. Axial
non-contrast 3 mm slices were carried out through the heart. The
data set was analyzed on a dedicated work station and scored using
the Agatson method.

[Series 3: casc 3.0 i36f 2 bestdiast 68 % · axial · 0.27mm/px · z∈[+1218,+1328]mm · 8 of 49 slices shown, 10 images]
[im 6/49  vessel]
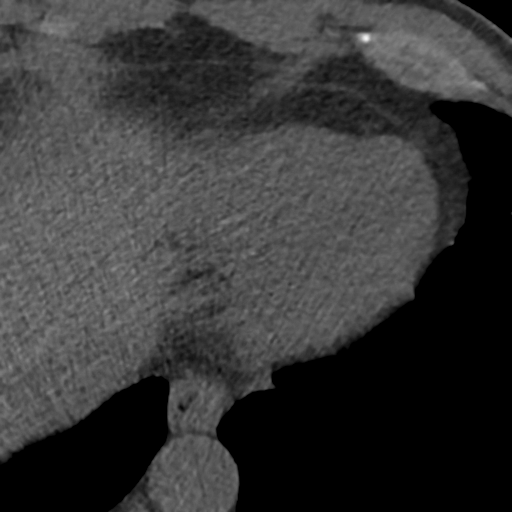
[im 6/49  lung]
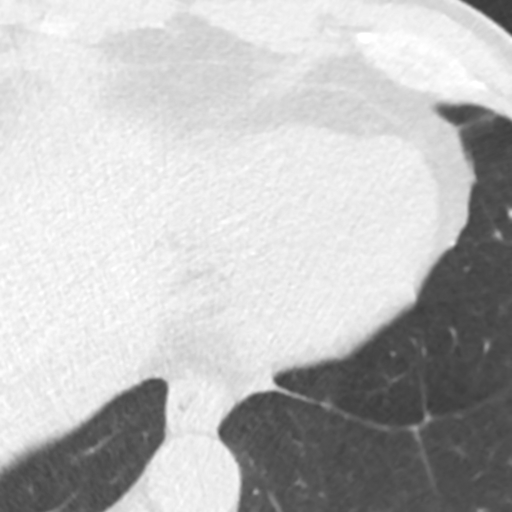
[im 11/49  vessel]
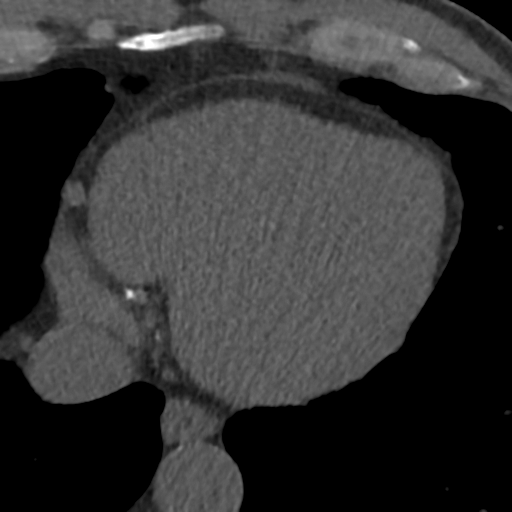
[im 17/49  vessel]
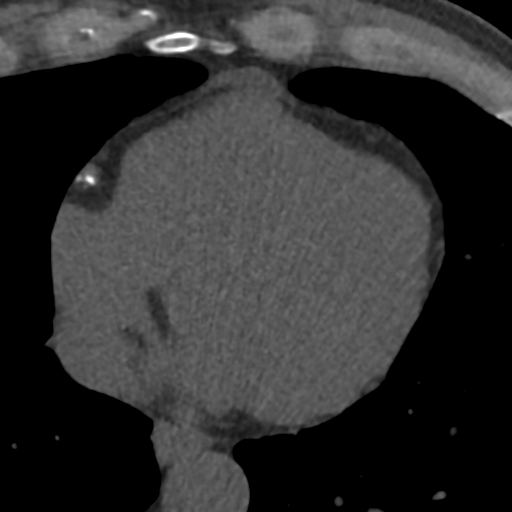
[im 22/49  vessel]
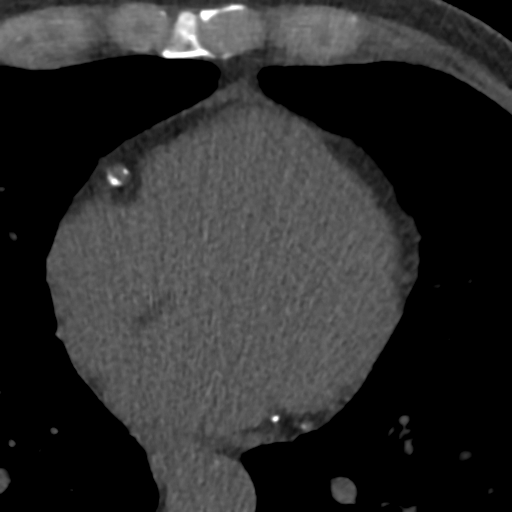
[im 27/49  vessel]
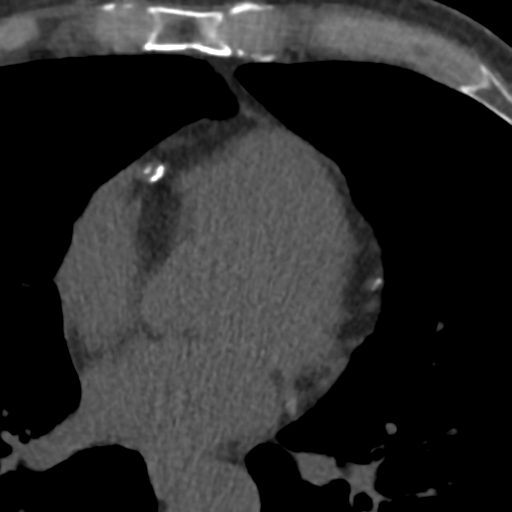
[im 27/49  lung]
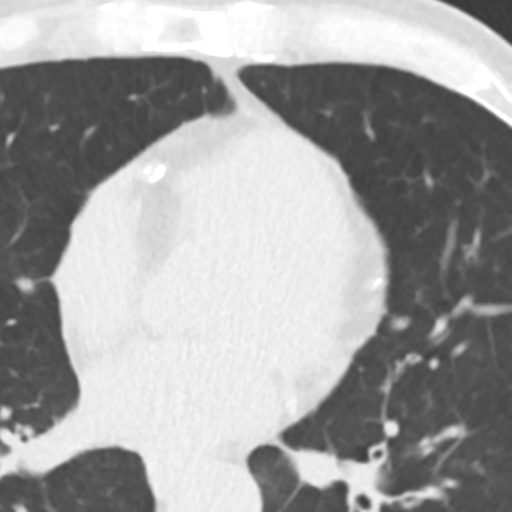
[im 33/49  vessel]
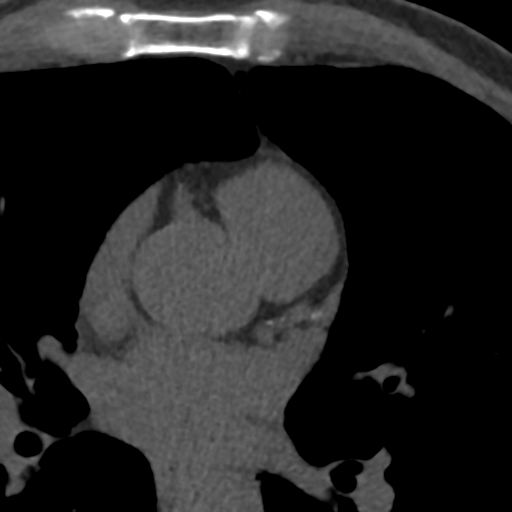
[im 38/49  vessel]
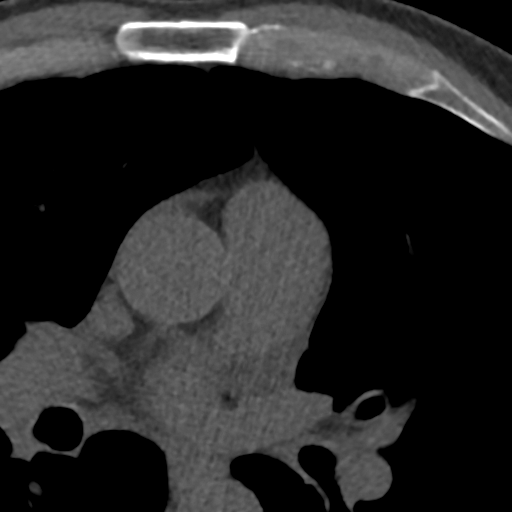
[im 43/49  vessel]
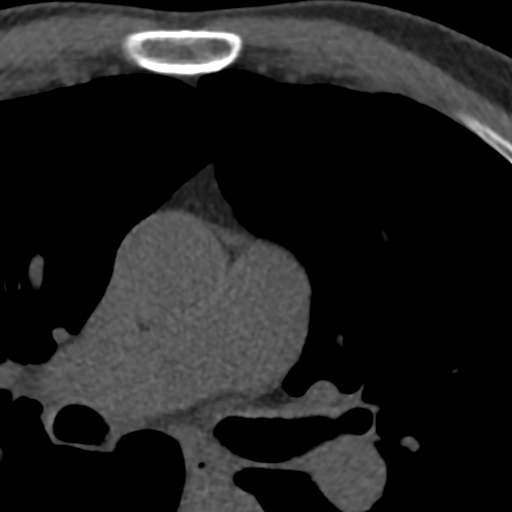

[Series 5: lung st 68 % · axial · 0.59mm/px · z∈[+1218,+1328]mm · 8 of 49 slices shown]
[im 6/49  lung]
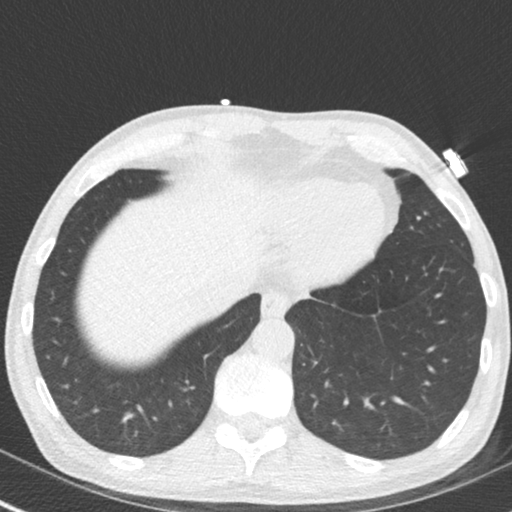
[im 11/49  lung]
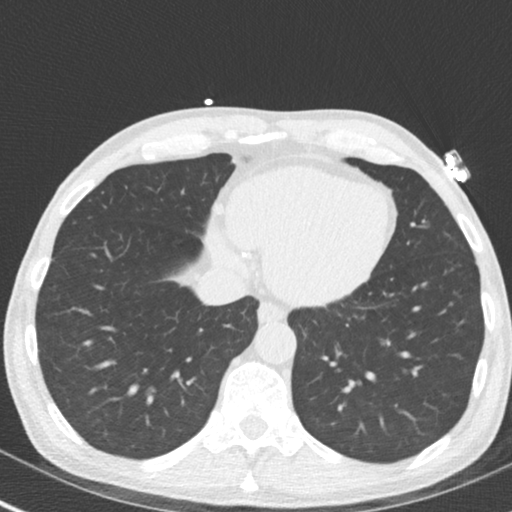
[im 17/49  lung]
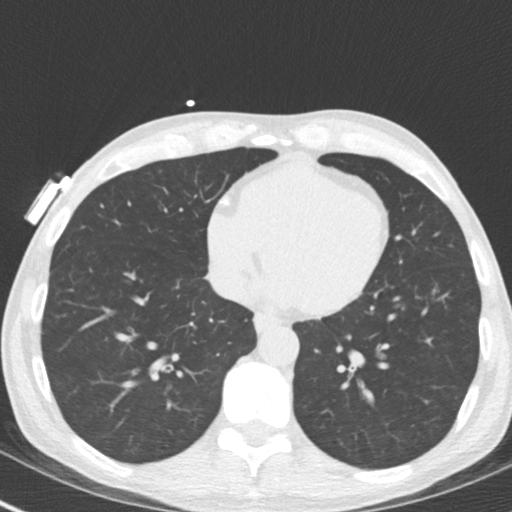
[im 22/49  lung]
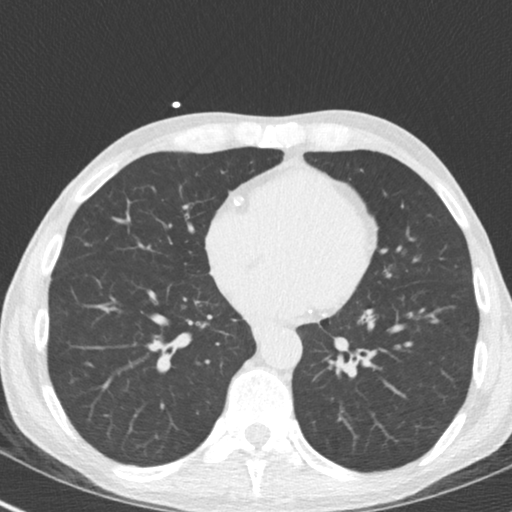
[im 27/49  lung]
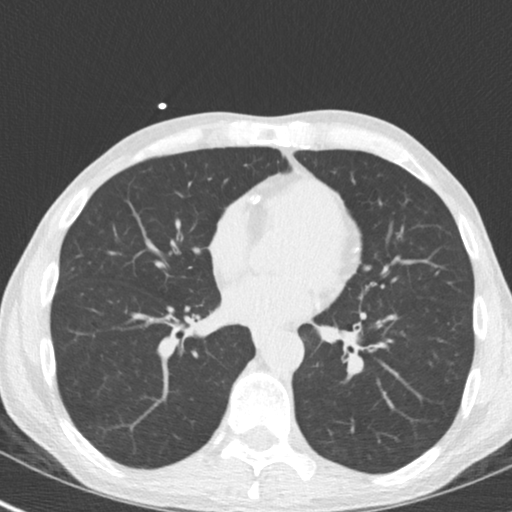
[im 33/49  lung]
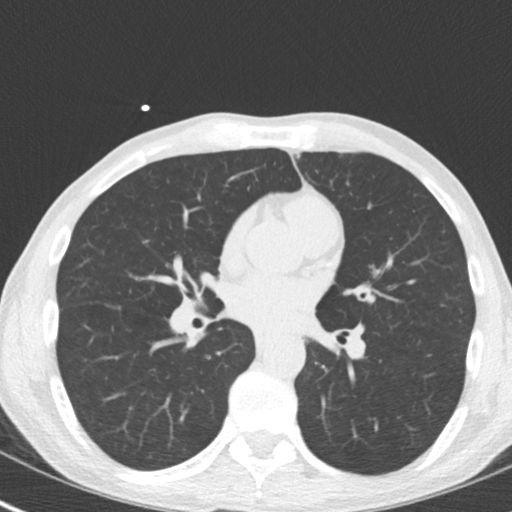
[im 38/49  lung]
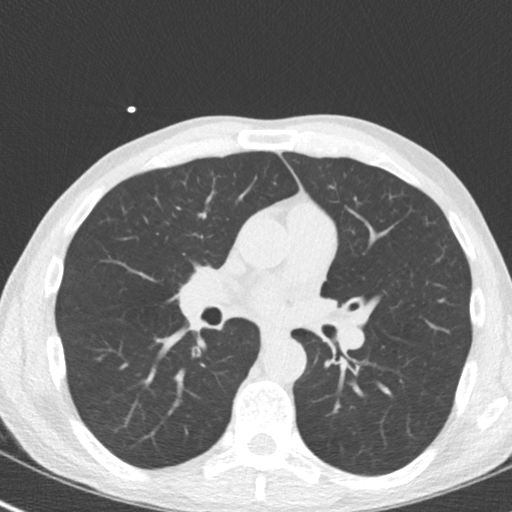
[im 43/49  lung]
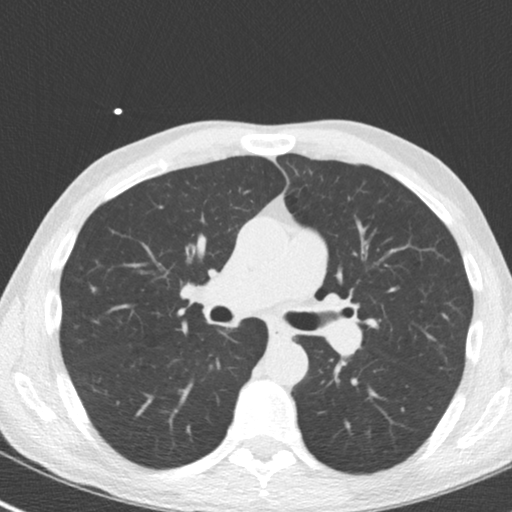

[16 of 20 positions shown; findings below may reference images not displayed]

FINDINGS: Vascular: No incidental findings.

Mediastinum/Nodes: No enlarged lymph nodes identified. 8 mm
nonenlarged precarinal lymph node identified.

Lungs/Pleura: Nodule within the superior segment of the left lower
lobe is best delineated on the coronal and sagittal reconstructions
and measures approximately 6 x 9 x 4 mm in greatest dimensions. This
nodule appears noncalcified. There is an additional 4 mm rounded
noncalcified nodule in the lateral left lower lobe. There is
evidence of probable mild emphysematous lung disease and minimal
scarring at the left base.

Upper Abdomen: No acute abnormality.

Musculoskeletal: No chest wall mass or suspicious bone lesions
identified.
IMPRESSION: 6 x 9 x 4 mm nodule in the superior segment of the left lower lobe
and 4 mm nodule in the lateral left lower lobe. 8 mm nonenlarged
precarinal lymph node identified. As the entire chest was not imaged
on the calcium score study, an initial CT of the chest with IV
contrast is recommended. This will better help to delineate
follow-up recommendations or further imaging recommendations
depending on detection of other nodules or lymphadenopathy.
FINDINGS: Non-cardiac: See separate report from [REDACTED].

Ascending aorta: Normal diameter 2.9 cm

Pericardium: Normal

Coronary arteries: Marked LM and 3 vessel coronary calcium
IMPRESSION: Coronary calcium score of 1611. This was 99 th percentile for age
and sex matched control.

Given the extent of calcium consider f/u perfusion study

See radiology note regarding pulmonary findings

Maurizio Hachem

*** End of Addendum ***

## 2020-11-13 ENCOUNTER — Encounter: Payer: Self-pay | Admitting: Cardiology

## 2020-11-13 ENCOUNTER — Ambulatory Visit: Payer: BC Managed Care – PPO | Admitting: Cardiology

## 2020-11-13 ENCOUNTER — Other Ambulatory Visit: Payer: Self-pay

## 2020-11-13 VITALS — BP 135/73 | HR 64 | Ht 70.0 in | Wt 137.6 lb

## 2020-11-13 DIAGNOSIS — F17200 Nicotine dependence, unspecified, uncomplicated: Secondary | ICD-10-CM

## 2020-11-13 DIAGNOSIS — I10 Essential (primary) hypertension: Secondary | ICD-10-CM

## 2020-11-13 DIAGNOSIS — Z7189 Other specified counseling: Secondary | ICD-10-CM

## 2020-11-13 DIAGNOSIS — I2510 Atherosclerotic heart disease of native coronary artery without angina pectoris: Secondary | ICD-10-CM

## 2020-11-13 DIAGNOSIS — E7800 Pure hypercholesterolemia, unspecified: Secondary | ICD-10-CM | POA: Diagnosis not present

## 2020-11-13 NOTE — Progress Notes (Signed)
Cardiology Office Note:    Date:  11/13/2020   ID:  Kyle Stafford, DOB [birth year], MRN [id number]  PCP:  Cassandria Anger, MD  Cardiologist:  Buford Dresser, MD PhD  Referring MD: Cassandria Anger, MD   CC: follow up  History of Present Illness:    Kyle Stafford is a 60 y.o. male with a hx of hypertension, hyperlipidemia, tobacco abuse, CAD detected by coronary calcium score.  Cardiac history: He had a CT cardiac score on 10/16/18 which showed LM/3V calcification with a score of 1416 (99th percentile). His most recent lipids from 09/25/18 noted Tchol 160, TG 133, HDL 34, LDL 99, ratio of 5. This was prior to being started on atorvastatin 10 mg. Risk factors include continued tobacco use. Adopted, no known FH.   Today: No energy, no interest in doing anything other than watching youtube videos. Needs to remodel the kitchen, has the lumber, but doesn't want to do it. Able to work, but knows he doesn't have a choice when he is there. Has a boat that he's been wanting to work on for 4 years, but no desire to work on this. We discussed potential causes of anhedonia today. He does have a history of anxiety. We discussed less typical signs of depression as well. Has been going on since at least 2016.  Still smoking, not interested in quitting.  Stopped taking amlodipine as it was making him lightheaded.  Able to lift heavy objects (>100 lbs), be active (works in Therapist, music work), walk etc without limitations.  Denies chest pain, shortness of breath at rest or with normal exertion. No PND, orthopnea, LE edema or unexpected weight gain. No syncope or palpitations.   Past Medical History:  Diagnosis Date   Hypertension    Lymphocytic colitis 02/20/2019   Personal history of colonic adenomas 07/05/2013    Past Surgical History:  Procedure Laterality Date   EYE SURGERY     as a toddler   knee injury     piece of steel removed    Current Medications: Current  Outpatient Medications on File Prior to Visit  Medication Sig   ALPRAZolam (NIRAVAM) 0.5 MG dissolvable tablet Take 1 tablet (0.5 mg total) by mouth 2 (two) times daily as needed for anxiety.   aspirin EC 81 MG tablet Take 81 mg by mouth daily.   atorvastatin (LIPITOR) 40 MG tablet Take 1 tablet (40 mg total) by mouth daily.   b complex vitamins tablet Take 1 tablet by mouth daily.   Cholecalciferol (VITAMIN D3) 2000 units capsule Take 1 capsule (2,000 Units total) by mouth daily.   fluticasone (FLONASE) 50 MCG/ACT nasal spray Place into both nostrils as needed for allergies or rhinitis.   losartan-hydrochlorothiazide (HYZAAR) 100-12.5 MG tablet Take 1 tablet by mouth daily.   Melatonin-Pyridoxine (MELATIN PO) Take by mouth as needed.   No current facility-administered medications on file prior to visit.    He isn't sure what his current combo pill is but he thinks it was switched.  Allergies:   Bupropion hcl and Penicillins   Social History   Tobacco Use   Smoking status: Current Every Day Smoker    Packs/day: 1.50    Types: Cigarettes   Smokeless tobacco: Never Used  Vaping Use   Vaping Use: Never used  Substance Use Topics   Alcohol use: Yes    Alcohol/week: 21.0 standard drinks    Types: 21 Cans of beer per week    Comment: 2 nights a  week   Drug use: No     Family History: The patient is adopted and does not know his biological family history.  ROS:   Please see the history of present illness.  Additional pertinent ROS otherwise unremarkable.   EKGs/Labs/Other Studies Reviewed:    The following studies were reviewed today: Lipids, CT cardiac score  EKG:  EKG is personally reviewed.  The ekg ordered 05/08/20 demonstrates sinus bradycardia at 55 bpm  Recent Labs: 10/12/2020: ALT 15; BUN 7; Creatinine, Ser 0.88; Hemoglobin 16.5; Platelets 215.0; Potassium 4.2; Sodium 133; TSH 1.46  Recent Lipid Panel    Component Value Date/Time   CHOL 125 10/12/2020 1153   CHOL  121 02/19/2019 1013   TRIG 78.0 10/12/2020 1153   HDL 44.20 10/12/2020 1153   HDL 39 (L) 02/19/2019 1013   CHOLHDL 3 10/12/2020 1153   VLDL 15.6 10/12/2020 1153   LDLCALC 65 10/12/2020 1153   LDLCALC 62 02/19/2019 1013   LDLDIRECT 104.1 05/15/2013 1301    Physical Exam:    VS:  BP 135/73   Pulse 64   Ht 5\' 10"  (1.778 m)   Wt 137 lb 9.6 oz (62.4 kg)   SpO2 96%   BMI 19.74 kg/m     Wt Readings from Last 3 Encounters:  11/13/20 137 lb 9.6 oz (62.4 kg)  10/12/20 135 lb (61.2 kg)  05/08/20 137 lb 12.8 oz (62.5 kg)    GEN: Well nourished, well developed in no acute distress HEENT: Normal, moist mucous membranes NECK: No JVD CARDIAC: regular rhythm, normal S1 and S2, no rubs or gallops. No murmur. VASCULAR: Radial and DP pulses 2+ bilaterally. No carotid bruits RESPIRATORY:  Clear to auscultation without rales, wheezing or rhonchi  ABDOMEN: Soft, non-tender, non-distended MUSCULOSKELETAL:  Ambulates independently SKIN: Warm and dry, no edema NEUROLOGIC:  Alert and oriented x 3. No focal neuro deficits noted. PSYCHIATRIC:  Normal affect    ASSESSMENT:    1. Coronary artery disease involving native coronary artery of native heart without angina pectoris   2. Pure hypercholesterolemia   3. Essential hypertension   4. TOBACCO USE DISORDER/SMOKER-SMOKING CESSATION DISCUSSED   5. Cardiac risk counseling   6. Counseling on health promotion and disease prevention    PLAN:    Anhedonia: defer to Dr. Alain Marion, but we spent time today discussed depression signs/symptoms. No high risk thoughts today.  CAD based on coronary calcium score: see prior note HT:[id number] on additional testing, red flag symptoms. -he is tolerating atorvastatin 40 mg. Reviewed goal of LDL <70 -discussed importance of tobacco cessation. He is not interested in quitting at this time -on aspirin 81 mg  Hypertension: just above goal today -he self-discontinued amlodipine due to lightheadedness. -continue  losartan-HCTZ t100/12.5 mg daily -he will monitor home BP numbers and call if not improved. Would restart lower dose of amlodipine.  Hyperlipidemia: hypercholesterolemia, with LDL goal <70 -unknown family history. -tolerating 40 mg atorvastatin -last LDL 65, at goal  Cardiac risk counseling and prevention recommendations: -recommend heart healthy/Mediterranean diet, with whole grains, fruits, vegetable, fish, lean meats, nuts, and olive oil. Limit salt. -recommend moderate walking, 3-5 times/week for 30-50 minutes each session. Aim for at least 150 minutes.week. Goal should be pace of 3 miles/hours, or walking 1.5 miles in 30 minutes -recommend avoidance of tobacco products. Avoid excess alcohol.  Plan for follow up: 1 year or sooner PRN  Medication Adjustments/Labs and Tests Ordered: Current medicines are reviewed at length with the patient today.  Concerns regarding medicines  are outlined above.  No orders of the defined types were placed in this encounter.  No orders of the defined types were placed in this encounter.   Patient Instructions  Medication Instructions:  Your Physician recommend you continue on your current medication as directed.    *If you need a refill on your cardiac medications before your next appointment, please call your pharmacy*   Lab Work: None   Testing/Procedures: None   Follow-Up: At [facility], you and your health needs are our priority.  As part of our continuing mission to provide you with exceptional heart care, we have created designated Provider Care Teams.  These Care Teams include your primary Cardiologist (physician) and Advanced Practice Providers (APPs -  Physician Assistants and Nurse Practitioners) who all work together to provide you with the care you need, when you need it.  We recommend signing up for the patient portal called "MyChart".  Sign up information is provided on this After Visit Summary.  MyChart is used to connect  with patients for Virtual Visits (Telemedicine).  Patients are able to view lab/test results, encounter notes, upcoming appointments, etc.  Non-urgent messages can be sent to your provider as well.   To learn more about what you can do with MyChart, go to [email or web address].    Your next appointment:   1 year(s) @ [address] [place], [place] [address]  The format for your next appointment:   In Person  Provider:   Buford Dresser, MD      Signed, Buford Dresser, MD PhD 11/13/2020  [place]

## 2020-11-13 NOTE — Patient Instructions (Signed)
Medication Instructions:  Your Physician recommend you continue on your current medication as directed.    *If you need a refill on your cardiac medications before your next appointment, please call your pharmacy*   Lab Work: None   Testing/Procedures: None   Follow-Up: At [facility], you and your health needs are our priority.  As part of our continuing mission to provide you with exceptional heart care, we have created designated Provider Care Teams.  These Care Teams include your primary Cardiologist (physician) and Advanced Practice Providers (APPs -  Physician Assistants and Nurse Practitioners) who all work together to provide you with the care you need, when you need it.  We recommend signing up for the patient portal called "MyChart".  Sign up information is provided on this After Visit Summary.  MyChart is used to connect with patients for Virtual Visits (Telemedicine).  Patients are able to view lab/test results, encounter notes, upcoming appointments, etc.  Non-urgent messages can be sent to your provider as well.   To learn more about what you can do with MyChart, go to [email or web address].    Your next appointment:   1 year(s) @ [address] [place], [place] [address]  The format for your next appointment:   In Person  Provider:   Bridgette Christopher, MD   

## 2021-03-07 ENCOUNTER — Encounter: Payer: Self-pay | Admitting: Cardiology

## 2021-07-05 ENCOUNTER — Other Ambulatory Visit: Payer: Self-pay

## 2021-07-05 ENCOUNTER — Ambulatory Visit
Admission: RE | Admit: 2021-07-05 | Discharge: 2021-07-05 | Disposition: A | Discharge status: Home / Self Care | Payer: BC Managed Care – PPO | Source: Ambulatory Visit | Attending: Physician Assistant | Admitting: Physician Assistant

## 2021-07-05 VITALS — BP 150/81 | HR 69 | Temp 98.0°F | Ht 70.0 in | Wt 130.0 lb

## 2021-07-05 DIAGNOSIS — B029 Zoster without complications: Secondary | ICD-10-CM | POA: Diagnosis not present

## 2021-07-05 MED ORDER — VALACYCLOVIR HCL 1 G PO TABS: 1000.0000 mg | ORAL_TABLET | Freq: Three times a day (TID) | ORAL | 0 refills | Status: AC

## 2021-07-05 MED ORDER — KETOROLAC TROMETHAMINE 30 MG/ML IJ SOLN
30.0000 mg | Freq: Once | INTRAMUSCULAR | Status: AC
  Administered 2021-07-05: 30 mg via INTRAMUSCULAR

## 2021-07-05 NOTE — ED Provider Notes (Signed)
[facility]    CSN: [id number] Arrival date & time: 07/05/21  1244      History   Chief Complaint Chief Complaint  Patient presents with   Appointment    HPI Kyle Stafford is a 60 y.o. male.   Patient here today for evaluation of right-sided headache and tenderness.  He is also had right-sided ear pain developed as well as some mild swelling of his right upper eyelid.  He has not had fever that he is aware of.  He reports symptoms have been present for 2 weeks and headache will resolve for several hours when he takes aspirin.  He has noticed a rash that is developed as well to his scalp and forehead but did not know if this was related.  The history is provided by the patient.   Past Medical History:  Diagnosis Date   Hypertension    Lymphocytic colitis 02/20/2019   Personal history of colonic adenomas 07/05/2013    Patient Active Problem List   Diagnosis Date Noted   Dyslipidemia 10/12/2020   Chronic sinusitis 10/12/2020   Lymphocytic colitis 02/20/2019   Coronary artery disease involving native coronary artery of native heart without angina pectoris 02/19/2019   Diarrhea 09/25/2018   Orchitis and epididymitis 05/26/2018   Tinea corporis 05/26/2018   Irritable bowel syndrome 05/26/2018   Shoulder weakness 09/19/2017   Alcoholism ([place]) 05/23/2014   History of colonic polyps 07/05/2013   Well adult exam 05/18/2012   [place] DISEASE 10/06/2009   Essential hypertension 04/02/2008   INSOMNIA, PERSISTENT 03/24/2008   GERD 03/24/2008   Wheezing 02/25/2008   TOBACCO USE DISORDER/SMOKER-SMOKING CESSATION DISCUSSED 12/18/2007   ERECTILE DYSFUNCTION 12/18/2007    Past Surgical History:  Procedure Laterality Date   EYE SURGERY     as a toddler   knee injury     piece of steel removed       Home Medications    Prior to Admission medications   Medication Sig Start Date End Date Taking? Authorizing Provider  ALPRAZolam (NIRAVAM) 0.5 MG dissolvable  tablet Take 1 tablet (0.5 mg total) by mouth 2 (two) times daily as needed for anxiety. 10/12/20  Yes Plotnikov, Evie Lacks, MD  aspirin EC 81 MG tablet Take 81 mg by mouth daily.   Yes [provider]  atorvastatin (LIPITOR) 40 MG tablet Take 1 tablet (40 mg total) by mouth daily. 10/12/20  Yes Plotnikov, Evie Lacks, MD  b complex vitamins tablet Take 1 tablet by mouth daily. 09/19/17  Yes Plotnikov, Evie Lacks, MD  Cholecalciferol (VITAMIN D3) 2000 units capsule Take 1 capsule (2,000 Units total) by mouth daily. 09/19/17  Yes Plotnikov, Evie Lacks, MD  fluticasone (FLONASE) 50 MCG/ACT nasal spray Place into both nostrils as needed for allergies or rhinitis.   Yes [provider]  losartan-hydrochlorothiazide (HYZAAR) 100-12.5 MG tablet Take 1 tablet by mouth daily. 10/12/20  Yes Plotnikov, Evie Lacks, MD  Melatonin-Pyridoxine (MELATIN PO) Take by mouth as needed.   Yes [provider]  valACYclovir (VALTREX) 1000 MG tablet Take 1 tablet (1,000 mg total) by mouth 3 (three) times daily for 7 days. 07/05/21 07/12/21 Yes Francene Finders, PA-C    Family History Family History  Adopted: Yes  Problem Relation Age of Onset   Stroke Mother    Hypertension Mother    Heart disease Father    Stroke Father    Stroke Sister    Hypertension Sister    Colon cancer Neg Hx  Social History Social History   Tobacco Use   Smoking status: Every Day    Packs/day: 1.50    Types: Cigarettes   Smokeless tobacco: Never  Vaping Use   Vaping Use: Never used  Substance Use Topics   Alcohol use: Yes    Alcohol/week: 21.0 standard drinks    Types: 21 Cans of beer per week    Comment: 2 nights a week   Drug use: No     Allergies   Bupropion hcl and Penicillins   Review of Systems Review of Systems  Constitutional:  Negative for chills and fever.  HENT:  Positive for ear pain and facial swelling. Negative for drooling.   Eyes:  Negative for discharge, redness and visual  disturbance.  Respiratory:  Negative for shortness of breath and wheezing.   Gastrointestinal:  Negative for nausea and vomiting.  Skin:  Positive for color change, rash and wound.  Neurological:  Positive for headaches. Negative for numbness.    Physical Exam Triage Vital Signs ED Triage Vitals  Enc Vitals Group     BP      Pulse      Resp      Temp      Temp src      SpO2      Weight      Height      Head Circumference      Peak Flow      Pain Score      Pain Loc      Pain Edu?      Excl. in [place]?    No data found.  Updated Vital Signs BP (!) 150/81 (BP Location: Left Arm)   Pulse 69   Temp 98 F (36.7 C) (Oral)   Ht 5\' 10"  (1.778 m)   Wt 130 lb (59 kg)   SpO2 93%   BMI 18.65 kg/m       Physical Exam Vitals and nursing note reviewed.  Constitutional:      General: He is not in acute distress.    Appearance: Normal appearance. He is not ill-appearing.  HENT:     Head: Normocephalic and atraumatic.     Right Ear: Tympanic membrane normal.     Left Ear: Tympanic membrane normal.     Nose: Nose normal. No congestion.  Eyes:     Conjunctiva/sclera: Conjunctivae normal.     Pupils: Pupils are equal, round, and reactive to light.     Comments: Mild swelling to right upper eyelid  Cardiovascular:     Rate and Rhythm: Normal rate.  Pulmonary:     Effort: Pulmonary effort is normal.  Skin:    Comments: Vesicular rash, some lesions excoriated,  noted diffusely to right upper scalp, frontal forehead, does not cross midline  Neurological:     Mental Status: He is alert.  Psychiatric:        Mood and Affect: Mood normal.        Behavior: Behavior normal.        Thought Content: Thought content normal.     UC Treatments / Results  Labs (all labs ordered are listed, but only abnormal results are displayed) Labs Reviewed - No data to display  EKG   Radiology No results found.  Procedures Procedures (including critical care time)  Medications Ordered  in UC Medications  ketorolac (TORADOL) 30 MG/ML injection 30 mg (30 mg Intramuscular Given 07/05/21 1348)    Initial Impression / Assessment and Plan / UC  Course  I have reviewed the triage vital signs and the nursing notes.  Pertinent labs & imaging results that were available during my care of the patient were reviewed by me and considered in my medical decision making (see chart for details).   Suspect symptoms related to zoster. Will treat with toradol injection in office to hopefully help with headaches, and valtrex prescribed. Recommended follow up with PCP if symptoms persist or with ophthalmologist if eye symptoms worsen in any way.   Final Clinical Impressions(s) / UC Diagnoses   Final diagnoses:  Herpes zoster without complication     Discharge Instructions      Follow up with PCP or Eye Dr if symptoms do not improve or worsen in any way.      ED Prescriptions     Medication Sig Dispense Auth. Provider   valACYclovir (VALTREX) 1000 MG tablet Take 1 tablet (1,000 mg total) by mouth 3 (three) times daily for 7 days. 21 tablet Francene Finders, PA-C      PDMP not reviewed this encounter.   Francene Finders, PA-C 07/05/21 1430

## 2021-07-05 NOTE — Discharge Instructions (Addendum)
Follow up with PCP or Eye Dr if symptoms do not improve or worsen in any way.

## 2021-07-05 NOTE — ED Triage Notes (Signed)
Patient c/o right sided headache x several weeks, now his right eye is swollen when waking up w/drainage, right ear pain.  Patient is taken Ibuprofen 600mg  w/relief.

## 2021-10-06 ENCOUNTER — Telehealth: Payer: Self-pay | Admitting: Internal Medicine

## 2021-10-06 ENCOUNTER — Other Ambulatory Visit: Payer: Self-pay | Admitting: Internal Medicine

## 2021-10-06 DIAGNOSIS — I2584 Coronary atherosclerosis due to calcified coronary lesion: Secondary | ICD-10-CM

## 2021-10-06 DIAGNOSIS — I10 Essential (primary) hypertension: Secondary | ICD-10-CM

## 2021-10-06 DIAGNOSIS — Z0000 Encounter for general adult medical examination without abnormal findings: Secondary | ICD-10-CM

## 2021-10-06 DIAGNOSIS — I2510 Atherosclerotic heart disease of native coronary artery without angina pectoris: Secondary | ICD-10-CM

## 2021-10-06 NOTE — Telephone Encounter (Signed)
Pt requesting order for labs prior to CPE on 10-28-2021

## 2021-10-07 NOTE — Telephone Encounter (Signed)
Okay.  Thanks.

## 2021-10-08 NOTE — Telephone Encounter (Signed)
Patient notified that Labs have been put in.

## 2021-10-28 ENCOUNTER — Encounter: Payer: Self-pay | Admitting: Internal Medicine

## 2021-10-28 ENCOUNTER — Ambulatory Visit (INDEPENDENT_AMBULATORY_CARE_PROVIDER_SITE_OTHER): Payer: BC Managed Care – PPO | Admitting: Internal Medicine

## 2021-10-28 ENCOUNTER — Other Ambulatory Visit: Payer: Self-pay

## 2021-10-28 VITALS — BP 120/64 | HR 65 | Temp 97.8°F | Ht 70.0 in | Wt 136.2 lb

## 2021-10-28 DIAGNOSIS — I2510 Atherosclerotic heart disease of native coronary artery without angina pectoris: Secondary | ICD-10-CM

## 2021-10-28 DIAGNOSIS — Z0000 Encounter for general adult medical examination without abnormal findings: Secondary | ICD-10-CM | POA: Diagnosis not present

## 2021-10-28 DIAGNOSIS — J320 Chronic maxillary sinusitis: Secondary | ICD-10-CM

## 2021-10-28 LAB — COMPREHENSIVE METABOLIC PANEL
ALT: 14 U/L (ref 0–53)
AST: 19 U/L (ref 0–37)
Albumin: 4.5 g/dL (ref 3.5–5.2)
Alkaline Phosphatase: 60 U/L (ref 39–117)
BUN: 12 mg/dL (ref 6–23)
CO2: 33 mEq/L — ABNORMAL HIGH (ref 19–32)
Calcium: 9.5 mg/dL (ref 8.4–10.5)
Chloride: 95 mEq/L — ABNORMAL LOW (ref 96–112)
Creatinine, Ser: 0.9 mg/dL (ref 0.40–1.50)
GFR: 93.92 mL/min (ref >60.00)
Glucose, Bld: 94 mg/dL (ref 70–99)
Potassium: 3.9 mEq/L (ref 3.5–5.1)
Sodium: 133 mEq/L — ABNORMAL LOW (ref 135–145)
Total Bilirubin: 0.7 mg/dL (ref 0.2–1.2)
Total Protein: 6.7 g/dL (ref 6.0–8.3)

## 2021-10-28 LAB — CBC WITH DIFFERENTIAL/PLATELET
Basophils Absolute: 0 10*3/uL (ref 0.0–0.1)
Basophils Relative: 0.7 % (ref 0.0–3.0)
Eosinophils Absolute: 0.1 10*3/uL (ref 0.0–0.7)
Eosinophils Relative: 2.1 % (ref 0.0–5.0)
HCT: 43.5 % (ref 39.0–52.0)
Hemoglobin: 14.9 g/dL (ref 13.0–17.0)
Lymphocytes Relative: 34.2 % (ref 12.0–46.0)
Lymphs Abs: 1.4 10*3/uL (ref 0.7–4.0)
MCHC: 34.2 g/dL (ref 30.0–36.0)
MCV: 99.1 fl (ref 78.0–100.0)
Monocytes Absolute: 0.6 10*3/uL (ref 0.1–1.0)
Monocytes Relative: 13.2 % — ABNORMAL HIGH (ref 3.0–12.0)
Neutro Abs: 2.1 10*3/uL (ref 1.4–7.7)
Neutrophils Relative %: 49.8 % (ref 43.0–77.0)
Platelets: 198 10*3/uL (ref 150.0–400.0)
RBC: 4.39 Mil/uL (ref 4.22–5.81)
RDW: 12.7 % (ref 11.5–15.5)
WBC: 4.2 10*3/uL (ref 4.0–10.5)

## 2021-10-28 LAB — LIPID PANEL
Cholesterol: 108 mg/dL (ref 0–200)
HDL: 51.6 mg/dL (ref >39.00)
LDL Cholesterol: 47 mg/dL (ref 0–99)
NonHDL: 56.73
Total CHOL/HDL Ratio: 2
Triglycerides: 51 mg/dL (ref 0.0–149.0)
VLDL: 10.2 mg/dL (ref 0.0–40.0)

## 2021-10-28 LAB — URINALYSIS
Bilirubin Urine: NEGATIVE
Hgb urine dipstick: NEGATIVE
Ketones, ur: NEGATIVE
Leukocytes,Ua: NEGATIVE
Nitrite: NEGATIVE
Specific Gravity, Urine: <=1.005 — AB (ref 1.000–1.030)
Total Protein, Urine: NEGATIVE
Urine Glucose: NEGATIVE
Urobilinogen, UA: 0.2 (ref 0.0–1.0)
pH: 7 (ref 5.0–8.0)

## 2021-10-28 LAB — PSA: PSA: 0.75 ng/mL (ref 0.10–4.00)

## 2021-10-28 LAB — TSH: TSH: 2.6 u[IU]/mL (ref 0.35–5.50)

## 2021-10-28 MED ORDER — MONTELUKAST SODIUM 10 MG PO TABS: 10.0000 mg | ORAL_TABLET | Freq: Every day | ORAL | 3 refills | Status: DC

## 2021-10-28 MED ORDER — LORATADINE 10 MG PO TABS: 10.0000 mg | ORAL_TABLET | Freq: Every day | ORAL | 3 refills | Status: AC

## 2021-10-28 NOTE — Assessment & Plan Note (Addendum)
?  We discussed age appropriate health related issues, including available/recomended screening tests and vaccinations. Labs were ordered to be later reviewed . All questions were answered. We discussed one or more of the following - seat belt use, use of sunscreen/sun exposure exercise, safe sex, fall risk reduction, second hand smoke exposure, firearm use and storage, seat belt use, a need for adhering to healthy diet and exercise. ?Labs were ordered.  All questions were answered. ?On ASA, Lipitor ?2 ppd smoker - needs to cut back and stop. Prevnar offered ?Colonoscopy 2014 - Dr Carlean Purl ?Pt declined a rectal exam  ?

## 2021-10-28 NOTE — Assessment & Plan Note (Signed)
Will try Singulair and Loratidine ?

## 2021-10-28 NOTE — Assessment & Plan Note (Signed)
2 ppd - try to reduce ?

## 2021-10-28 NOTE — Progress Notes (Signed)
? ?Subjective:  ?Patient ID: Kyle Stafford, male    DOB: [birth year]  Age: 61 y.o. MRN: [id number] ? ?CC: Annual Exam ? ? ?HPI ?[name] presents for well exam ?C/o runny nose - all the time  ? ?Outpatient Medications Prior to Visit  ?Medication Sig Dispense Refill  ? ALPRAZolam (NIRAVAM) 0.5 MG dissolvable tablet Take 1 tablet (0.5 mg total) by mouth 2 (two) times daily as needed for anxiety. 60 tablet 3  ? aspirin EC 81 MG tablet Take 81 mg by mouth daily.    ? atorvastatin (LIPITOR) 40 MG tablet TAKE 1 TABLET DAILY 90 tablet 3  ? b complex vitamins tablet Take 1 tablet by mouth daily. 100 tablet 3  ? Cholecalciferol (VITAMIN D3) 2000 units capsule Take 1 capsule (2,000 Units total) by mouth daily. 100 capsule 3  ? fluticasone (FLONASE) 50 MCG/ACT nasal spray Place into both nostrils as needed for allergies or rhinitis.    ? losartan-hydrochlorothiazide (HYZAAR) 100-12.5 MG tablet TAKE 1 TABLET DAILY 90 tablet 3  ? Melatonin-Pyridoxine (MELATIN PO) Take by mouth as needed.    ? ?No facility-administered medications prior to visit.  ? ? ?ROS: ?Review of Systems  ?Constitutional:  Negative for appetite change, fatigue and unexpected weight change.  ?HENT:  Positive for postnasal drip, rhinorrhea and sinus pressure. Negative for congestion, ear pain, nosebleeds, sneezing, sore throat and trouble swallowing.   ?Eyes:  Negative for itching and visual disturbance.  ?Respiratory:  Negative for cough.   ?Cardiovascular:  Negative for chest pain, palpitations and leg swelling.  ?Gastrointestinal:  Negative for abdominal distention, blood in stool, diarrhea and nausea.  ?Genitourinary:  Negative for frequency and hematuria.  ?Musculoskeletal:  Negative for back pain, gait problem, joint swelling and neck pain.  ?Skin:  Negative for rash.  ?Neurological:  Negative for dizziness, tremors, speech difficulty and weakness.  ?Psychiatric/Behavioral:  Negative for agitation, dysphoric mood, sleep disturbance and suicidal  ideas. The patient is not nervous/anxious.   ? ?Objective:  ?BP 120/64 (BP Location: Left Arm, Patient Position: Sitting, Cuff Size: Large)   Pulse 65   Temp 97.8 ?F (36.6 ?C) (Oral)   Ht 5\' 10"  (1.778 m)   Wt 136 lb 3.2 oz (61.8 kg)   SpO2 98%   BMI 19.54 kg/m?  ? ?BP Readings from Last 3 Encounters:  ?10/28/21 120/64  ?07/05/21 (!) 150/81  ?11/13/20 135/73  ? ? ?Wt Readings from Last 3 Encounters:  ?10/28/21 136 lb 3.2 oz (61.8 kg)  ?07/05/21 130 lb (59 kg)  ?11/13/20 137 lb 9.6 oz (62.4 kg)  ? ? ?Physical Exam ?Constitutional:   ?   General: He is not in acute distress. ?   Appearance: He is well-developed.  ?   Comments: NAD  ?HENT:  ?   Right Ear: There is no impacted cerumen.  ?   Left Ear: There is no impacted cerumen.  ?   Nose: Congestion present.  ?   Mouth/Throat:  ?   Pharynx: Posterior oropharyngeal erythema present.  ?Eyes:  ?   Conjunctiva/sclera: Conjunctivae normal.  ?   Pupils: Pupils are equal, round, and reactive to light.  ?Neck:  ?   Thyroid: No thyromegaly.  ?   Vascular: No JVD.  ?Cardiovascular:  ?   Rate and Rhythm: Normal rate and regular rhythm.  ?   Heart sounds: Normal heart sounds. No murmur heard. ?  No friction rub. No gallop.  ?Pulmonary:  ?   Effort: Pulmonary effort is normal.  No respiratory distress.  ?   Breath sounds: Normal breath sounds. No wheezing or rales.  ?Chest:  ?   Chest wall: No tenderness.  ?Abdominal:  ?   General: Bowel sounds are normal. There is no distension.  ?   Palpations: Abdomen is soft. There is no mass.  ?   Tenderness: There is no abdominal tenderness. There is no guarding or rebound.  ?Musculoskeletal:     ?   General: No tenderness. Normal range of motion.  ?   Cervical back: Normal range of motion.  ?Lymphadenopathy:  ?   Cervical: No cervical adenopathy.  ?Skin: ?   General: Skin is warm and dry.  ?   Findings: No rash.  ?Neurological:  ?   Mental Status: He is alert and oriented to person, place, and time.  ?   Cranial Nerves: No cranial  nerve deficit.  ?   Motor: No abnormal muscle tone.  ?   Coordination: Coordination normal.  ?   Gait: Gait normal.  ?   Deep Tendon Reflexes: Reflexes are normal and symmetric.  ?Psychiatric:     ?   Behavior: Behavior normal.     ?   Thought Content: Thought content normal.     ?   Judgment: Judgment normal.  ?Congested TMs, nares ?Thin ?Pt declined rectal exam ? ?Lab Results  ?Component Value Date  ? WBC 6.6 10/12/2020  ? HGB 16.5 10/12/2020  ? HCT 48.0 10/12/2020  ? PLT 215.0 10/12/2020  ? GLUCOSE 97 10/12/2020  ? CHOL 125 10/12/2020  ? TRIG 78.0 10/12/2020  ? HDL 44.20 10/12/2020  ? LDLDIRECT 104.1 05/15/2013  ? [place] 65 10/12/2020  ? ALT 15 10/12/2020  ? AST 17 10/12/2020  ? NA 133 (L) 10/12/2020  ? K 4.2 10/12/2020  ? CL 95 (L) 10/12/2020  ? CREATININE 0.88 10/12/2020  ? BUN 7 10/12/2020  ? CO2 31 10/12/2020  ? TSH 1.46 10/12/2020  ? PSA 0.95 10/12/2020  ? ? ?No results found. ? ?Assessment & Plan:  ? ?Problem List Items Addressed This Visit   ? ? Chronic sinusitis  ?  Will try Singulair and Loratidine ?  ?  ? Relevant Medications  ? loratadine (CLARITIN) 10 MG tablet  ? Other Relevant Orders  ? Ambulatory referral to ENT  ? Coronary artery disease involving native coronary artery of native heart without angina pectoris  ?  2 ppd - try to reduce ?  ?  ? Well adult exam - [place]  ?   ?We discussed age appropriate health related issues, including available/recomended screening tests and vaccinations. Labs were ordered to be later reviewed . All questions were answered. We discussed one or more of the following - seat belt use, use of sunscreen/sun exposure exercise, safe sex, fall risk reduction, second hand smoke exposure, firearm use and storage, seat belt use, a need for adhering to healthy diet and exercise. ?Labs were ordered.  All questions were answered. ?On ASA, Lipitor ?2 ppd smoker - needs to cut back and stop ?Colonoscopy 2014 - Dr Carlean Purl ?Pt declined a rectal exam  ?  ?  ? Relevant Orders  ? TSH   ? Urinalysis  ? CBC with Differential/Platelet  ? Lipid panel  ? PSA  ? Comprehensive metabolic panel  ?  ? ? ?Meds ordered this encounter  ?Medications  ? loratadine (CLARITIN) 10 MG tablet  ?  Sig: Take 1 tablet (10 mg total) by mouth daily.  ?  Dispense:  90 tablet  ?  Refill:  3  ? montelukast (SINGULAIR) 10 MG tablet  ?  Sig: Take 1 tablet (10 mg total) by mouth daily.  ?  Dispense:  90 tablet  ?  Refill:  3  ?  ? ? ?Follow-up: Return in about 6 months (around 04/30/2022) for a follow-up visit. ? ?Walker Kehr, MD ?

## 2021-11-12 NOTE — Progress Notes (Signed)
?Cardiology Office Note:   ? ?Date:  11/15/2021  ? ?ID:  Kyle Stafford, DOB 1961-04-08, MRN [id number] ? ?PCP:  Plotnikov, Evie Lacks, MD  ?Cardiologist:  Buford Dresser, MD PhD ? ?Referring MD: Cassandria Anger, MD  ? ?CC: follow up ? ?History of Present Illness:   ? ?FAVIO MODER is a 61 y.o. male with a hx of hypertension, hyperlipidemia, tobacco abuse, CAD detected by coronary calcium score. ? ?Cardiac history: ?He had a CT cardiac score on 10/16/18 which showed LM/3V calcification with a score of 1416 (99th percentile). His most recent lipids from 09/25/18 noted Tchol 160, TG 133, HDL 34, LDL 99, ratio of 5. This was prior to being started on atorvastatin 10 mg. Risk factors include continued tobacco use. Adopted, no known FH.  ? ?Today: ?Overall, he is feeling okay aside from constant fatigue and little interest in doing anything. Also, he feels like his blood pressure medication is not as effective lately. He is not monitoring his BP at home, but he feels like it is running too high.  ? ?He endorses shortness of breath, as well as chest pain that he is more accustomed to. Also, he complains of urinary urgency when he begins to exercise, or otherwise after exertion. He is not sure if this would decrease if he continued after using the restroom. Of note, he seems to be more adjusted to his HCTZ, as this does not cause as much urinary frequency. Sometimes at work he is limited by his symptoms. He states his main limiting symptom is shortness of breath. ? ?He complains of frequent congestion and rhinorrhea. Taking claritin has not been helpful for him. ? ?During work, he typically walks 6-8 miles per shift. ? ?Still smoking, not interested in quitting. ? ?He denies any palpitations, or peripheral edema. No lightheadedness, headaches, syncope, orthopnea, or PND. ? ? ?Past Medical History:  ?Diagnosis Date  ? Hypertension   ? Lymphocytic colitis 02/20/2019  ? Personal history of colonic adenomas 07/05/2013   ? ? ?Past Surgical History:  ?Procedure Laterality Date  ? EYE SURGERY    ? as a toddler  ? knee injury    ? piece of steel removed  ? ? ?Current Medications: ?Current Outpatient Medications on File Prior to Visit  ?Medication Sig  ? ALPRAZolam (NIRAVAM) 0.5 MG dissolvable tablet Take 1 tablet (0.5 mg total) by mouth 2 (two) times daily as needed for anxiety.  ? aspirin EC 81 MG tablet Take 81 mg by mouth daily.  ? atorvastatin (LIPITOR) 40 MG tablet TAKE 1 TABLET DAILY  ? b complex vitamins tablet Take 1 tablet by mouth daily.  ? Cholecalciferol (VITAMIN D3) 2000 units capsule Take 1 capsule (2,000 Units total) by mouth daily.  ? fluticasone (FLONASE) 50 MCG/ACT nasal spray Place into both nostrils as needed for allergies or rhinitis.  ? loratadine (CLARITIN) 10 MG tablet Take 1 tablet (10 mg total) by mouth daily.  ? losartan-hydrochlorothiazide (HYZAAR) 100-12.5 MG tablet TAKE 1 TABLET DAILY  ? Melatonin-Pyridoxine (MELATIN PO) Take by mouth as needed.  ? montelukast (SINGULAIR) 10 MG tablet Take 1 tablet (10 mg total) by mouth daily.  ? ?No current facility-administered medications on file prior to visit.  ?  ? ?Allergies:   Bupropion hcl and Penicillins  ? ?Social History  ? ?Tobacco Use  ? Smoking status: Every Day  ?  Packs/day: 1.50  ?  Types: Cigarettes  ? Smokeless tobacco: Never  ?Vaping Use  ? Vaping Use:  Never used  ?Substance Use Topics  ? Alcohol use: Yes  ?  Alcohol/week: 21.0 standard drinks  ?  Types: 21 Cans of beer per week  ?  Comment: 2 nights a week  ? Drug use: No  ?  ? ?Family History: ?The patient is adopted and does not know his biological family history. ? ?ROS:   ?Please see the history of present illness.   ?(+) Fatigue ?(+) Shortness of breath ?(+) Urinary urgency with exertion ?(+) Congestion ?(+) Rhinorrhea ?Additional pertinent ROS otherwise unremarkable. ? ? ?EKGs/Labs/Other Studies Reviewed:   ? ?The following studies were reviewed today: ?Lipids ? ?CT cardiac score  10/16/2018: ?FINDINGS: ?Non-cardiac: See separate report from [facility]. ?  ?Ascending aorta: Normal diameter 2.9 cm ?  ?Pericardium: Normal ?  ?Coronary arteries: Marked LM and 3 vessel coronary calcium ?  ?IMPRESSION: ?Coronary calcium score of 1416. This was 36 th percentile for age ?and sex matched control. ?  ?Given the extent of calcium consider f/u perfusion study ?  ?See radiology note regarding pulmonary findings ? ?EKG:  EKG is personally reviewed.   ?11/15/2021: NSR at 72 bpm, biatrial enlargement, pulmonary pattern ?05/08/20: sinus bradycardia at 55 bpm ? ?Recent Labs: ?10/28/2021: ALT 14; BUN 12; Creatinine, Ser 0.90; Hemoglobin 14.9; Platelets 198.0; Potassium 3.9; Sodium 133; TSH 2.60  ? ?Recent Lipid Panel ?   ?Component Value Date/Time  ? CHOL 108 10/28/2021 1003  ? CHOL 121 02/19/2019 1013  ? TRIG 51.0 10/28/2021 1003  ? HDL 51.60 10/28/2021 1003  ? HDL 39 (L) 02/19/2019 1013  ? CHOLHDL 2 10/28/2021 1003  ? VLDL 10.2 10/28/2021 1003  ? LDLCALC 47 10/28/2021 1003  ? [place] 62 02/19/2019 1013  ? LDLDIRECT 104.1 05/15/2013 1301  ? ? ?Physical Exam:   ? ?VS:  BP 130/76 (BP Location: Left Arm, Patient Position: Sitting, Cuff Size: Normal)   Pulse 72   Ht '5\' 10"'$  (1.778 m)   Wt 134 lb 3.2 oz (60.9 kg)   BMI 19.26 kg/m?    ? ?Wt Readings from Last 3 Encounters:  ?11/15/21 134 lb 3.2 oz (60.9 kg)  ?10/28/21 136 lb 3.2 oz (61.8 kg)  ?07/05/21 130 lb (59 kg)  ?  ?GEN: Well nourished, well developed in no acute distress ?HEENT: Normal, moist mucous membranes ?NECK: No JVD ?CARDIAC: regular rhythm, normal S1 and S2, no rubs or gallops. No murmur. ?VASCULAR: Radial and DP pulses 2+ bilaterally. No carotid bruits ?RESPIRATORY:  Abnormal diffuse breath sounds that improve with cough, expiratory wheezing, no rales, or rhonchi  ?ABDOMEN: Soft, non-tender, non-distended ?MUSCULOSKELETAL:  Ambulates independently ?SKIN: Warm and dry, no edema ?NEUROLOGIC:  Alert and oriented x 3. No focal neuro deficits  noted. ?PSYCHIATRIC:  Normal affect   ? ?ASSESSMENT:   ? ?1. Coronary artery disease of native artery of native heart with stable angina pectoris ([place])   ?2. Pure hypercholesterolemia   ?3. Essential hypertension   ?4. Tobacco abuse   ?5. Cardiac risk counseling   ?6. Counseling on health promotion and disease prevention   ? ? ?PLAN:   ? ?Anhedonia: has no interest in activities that used to be pleasurable for him. Also endorses some increased risk taking several weeks ago. He reports that he has discussed depression with Dr. Alain Marion. We discussed that starting beta blocker in some people may change his symptoms. He will contact me or Dr. Alain Marion with any new symptoms. ? ?CAD, with stable angina ?Hypercholesterolemia ?-difficult to get specific information regarding chest pain  and shortness of breath, but he notes it is stable and not limiting ?-reviewed red flag warning signs that need immediate medical attention ?-we discussed antianginals. Will cautiously start metoprolol low dose. Discussed side effects ?-he is tolerating atorvastatin 40 mg. Reviewed goal of LDL <70, last LDL 47 ?-discussed importance of tobacco cessation. He is not interested in quitting at this time ?-on aspirin 81 mg ? ?Hypertension: at goal today ?-he self-discontinued amlodipine due to lightheadedness. ?-he feels that sometimes his blood pressure is high, but he has not checked it at those times. Discussed home blood pressure monitoring ?-continue losartan-HCTZ 100/12.5 mg daily ? ?Cardiac risk counseling and prevention recommendations: ?-recommend heart healthy/Mediterranean diet, with whole grains, fruits, vegetable, fish, lean meats, nuts, and olive oil. Limit salt. ?-recommend moderate walking, 3-5 times/week for 30-50 minutes each session. Aim for at least 150 minutes.week. Goal should be pace of 3 miles/hours, or walking 1.5 miles in 30 minutes ?-recommend avoidance of tobacco products. Avoid excess alcohol. ? ?Plan for follow up:  6 months or sooner PRN ? ?Medication Adjustments/Labs and Tests Ordered: ?Current medicines are reviewed at length with the patient today.  Concerns regarding medicines are outlined above.  ? ?Orders Placed This Encoun

## 2021-11-15 ENCOUNTER — Other Ambulatory Visit: Payer: Self-pay

## 2021-11-15 ENCOUNTER — Ambulatory Visit (HOSPITAL_BASED_OUTPATIENT_CLINIC_OR_DEPARTMENT_OTHER): Payer: BC Managed Care – PPO | Admitting: Cardiology

## 2021-11-15 ENCOUNTER — Encounter (HOSPITAL_BASED_OUTPATIENT_CLINIC_OR_DEPARTMENT_OTHER): Payer: Self-pay | Admitting: Cardiology

## 2021-11-15 VITALS — BP 130/76 | HR 72 | Ht 70.0 in | Wt 134.2 lb

## 2021-11-15 DIAGNOSIS — I10 Essential (primary) hypertension: Secondary | ICD-10-CM | POA: Diagnosis not present

## 2021-11-15 DIAGNOSIS — E7800 Pure hypercholesterolemia, unspecified: Secondary | ICD-10-CM | POA: Diagnosis not present

## 2021-11-15 DIAGNOSIS — Z720 Tobacco use: Secondary | ICD-10-CM

## 2021-11-15 DIAGNOSIS — Z7189 Other specified counseling: Secondary | ICD-10-CM

## 2021-11-15 DIAGNOSIS — I25118 Atherosclerotic heart disease of native coronary artery with other forms of angina pectoris: Secondary | ICD-10-CM | POA: Diagnosis not present

## 2021-11-15 MED ORDER — METOPROLOL SUCCINATE ER 25 MG PO TB24: 25.0000 mg | ORAL_TABLET | Freq: Every day | ORAL | 3 refills | Status: DC

## 2021-11-15 NOTE — Patient Instructions (Addendum)
Medication Instructions:  ?START METOPROLOL SUCC 25 MG DAILY  ? ?Labwork: ?NONE ? ?Testing/Procedures: ?NONE ? ?Follow-Up: ?6 MONTHS WITH DR Harrell Gave  ? ?Any Other Special Instructions Will Be Listed Below (If Applicable). ? ?If claritin (loratadine) isn't helping, you can try something else over the counter, like zyrtec (ceterizine) or allegra. ?

## 2022-02-24 ENCOUNTER — Ambulatory Visit: Payer: BC Managed Care – PPO | Admitting: Internal Medicine

## 2022-02-24 VITALS — BP 142/70 | HR 72 | Temp 97.9°F | Ht 70.0 in | Wt 132.1 lb

## 2022-02-24 DIAGNOSIS — J441 Chronic obstructive pulmonary disease with (acute) exacerbation: Secondary | ICD-10-CM

## 2022-02-24 DIAGNOSIS — R911 Solitary pulmonary nodule: Secondary | ICD-10-CM

## 2022-02-24 DIAGNOSIS — R079 Chest pain, unspecified: Secondary | ICD-10-CM

## 2022-02-24 DIAGNOSIS — I10 Essential (primary) hypertension: Secondary | ICD-10-CM | POA: Diagnosis not present

## 2022-02-24 DIAGNOSIS — F17210 Nicotine dependence, cigarettes, uncomplicated: Secondary | ICD-10-CM | POA: Diagnosis not present

## 2022-02-24 DIAGNOSIS — F17200 Nicotine dependence, unspecified, uncomplicated: Secondary | ICD-10-CM

## 2022-02-24 MED ORDER — LEVOFLOXACIN 500 MG PO TABS: 500.0000 mg | ORAL_TABLET | Freq: Every day | ORAL | 0 refills | Status: AC

## 2022-02-24 MED ORDER — PREDNISONE 10 MG PO TABS: ORAL_TABLET | ORAL | 0 refills | Status: DC

## 2022-02-24 MED ORDER — ALBUTEROL SULFATE HFA 108 (90 BASE) MCG/ACT IN AERS: 2.0000 | INHALATION_SPRAY | Freq: Four times a day (QID) | RESPIRATORY_TRACT | 5 refills | Status: DC | PRN

## 2022-02-24 MED ORDER — HYDROCODONE BIT-HOMATROP MBR 5-1.5 MG/5ML PO SOLN: 5.0000 mL | Freq: Four times a day (QID) | ORAL | 0 refills | Status: AC | PRN

## 2022-02-24 MED ORDER — METHYLPREDNISOLONE ACETATE 80 MG/ML IJ SUSP
80.0000 mg | Freq: Once | INTRAMUSCULAR | Status: AC
  Administered 2022-02-24: 80 mg via INTRAMUSCULAR

## 2022-02-24 NOTE — Progress Notes (Signed)
Patient ID: Kyle Stafford, male   DOB: [birth year], 61 y.o.   MRN: [id number]        Chief Complaint: follow up sob wheezing chest pain and hx of pulm nodule       HPI:  Kyle Stafford is a 61 y.o. male here with hx oc copd not on inhaler prn, with 4 days sudden onset moderate sob with non prod cough, wheezing, fleetng anterior verious site sharp chest pains with cough, and mild doe, but denies HA, fever, chills, ST, and Pt denies other chest pain, orthopnea, PND, increased LE swelling, palpitations, dizziness or syncope. Does not check BP at home.  Has hx of LUL pulm nodule small by prior CT cardiac score feb 2020 and now realizes he may not be invincible and asking for tx and f/u imaging.  Still smoking, not ready to quit, certainly not today, and is ok with referral to pulm to start LDCT yearly screening.  Most recent labs mar 2023 essentially normal, declines f/u lab, ecg and even cxr today, though I encouraged to r/o vascular overload.  No previous echo on chart.  Most recent Card CT Score was 1416 with LM and 3V CAD. No prior stress testing on epic.  Has been followed per cardiology last visit mar 2023.       Wt Readings from Last 3 Encounters:  02/24/22 132 lb 2 oz (59.9 kg)  11/15/21 134 lb 3.2 oz (60.9 kg)  10/28/21 136 lb 3.2 oz (61.8 kg)   BP Readings from Last 3 Encounters:  02/24/22 (!) 142/70  11/15/21 130/76  10/28/21 120/64         Past Medical History:  Diagnosis Date   Hypertension    Lymphocytic colitis 02/20/2019   Personal history of colonic adenomas 07/05/2013   Past Surgical History:  Procedure Laterality Date   EYE SURGERY     as a toddler   knee injury     piece of steel removed    reports that he has been smoking cigarettes. He has been smoking an average of 1.5 packs per day. He has never used smokeless tobacco. He reports current alcohol use of about 21.0 standard drinks of alcohol per week. He reports that he does not use drugs. family history includes  Heart disease in his father; Hypertension in his mother and sister; Stroke in his father, mother, and sister. He was adopted. Allergies  Allergen Reactions   Bupropion Hcl     REACTION: not feeling well   Penicillins    Current Outpatient Medications on File Prior to Visit  Medication Sig Dispense Refill   ALPRAZolam (NIRAVAM) 0.5 MG dissolvable tablet Take 1 tablet (0.5 mg total) by mouth 2 (two) times daily as needed for anxiety. 60 tablet 3   aspirin EC 81 MG tablet Take 81 mg by mouth daily.     atorvastatin (LIPITOR) 40 MG tablet TAKE 1 TABLET DAILY 90 tablet 3   b complex vitamins tablet Take 1 tablet by mouth daily. 100 tablet 3   Cholecalciferol (VITAMIN D3) 2000 units capsule Take 1 capsule (2,000 Units total) by mouth daily. 100 capsule 3   fluticasone (FLONASE) 50 MCG/ACT nasal spray Place into both nostrils as needed for allergies or rhinitis.     loratadine (CLARITIN) 10 MG tablet Take 1 tablet (10 mg total) by mouth daily. 90 tablet 3   losartan-hydrochlorothiazide (HYZAAR) 100-12.5 MG tablet TAKE 1 TABLET DAILY 90 tablet 3   Melatonin-Pyridoxine (MELATIN PO) Take by mouth  as needed.     montelukast (SINGULAIR) 10 MG tablet Take 1 tablet (10 mg total) by mouth daily. 90 tablet 3   metoprolol succinate (TOPROL XL) 25 MG 24 hr tablet Take 1 tablet (25 mg total) by mouth daily. (Patient not taking: Reported on 02/24/2022) 90 tablet 3   No current facility-administered medications on file prior to visit.        ROS:  All others reviewed and negative.  Objective        PE:  BP (!) 142/70   Pulse 72   Temp 97.9 F (36.6 C) (Oral)   Ht '5\' 10"'$  (1.778 m)   Wt 132 lb 2 oz (59.9 kg)   SpO2 96%   BMI 18.96 kg/m                 Constitutional: Pt appears in NAD, not using accessory muscle               HENT: Head: NCAT.                Right Ear: External ear normal.                 Left Ear: External ear normal.                Eyes: . Pupils are equal, round, and reactive to  light. Conjunctivae and EOM are normal               Nose: without d/c or deformity               Neck: Neck supple. Gross normal ROM               Cardiovascular: Normal rate and regular rhythm.                 Pulmonary/Chest: Effort normal and breath sounds decreasd without rales but with mild diffuse bilateral wheezing.                Neurological: Pt is alert. At baseline orientation, motor grossly intact               Skin: Skin is warm. No rashes, no other new lesions, LE edema - none               Psychiatric: Pt behavior is normal without agitation   Micro: none  Cardiac tracings I have personally interpreted today:  none  Pertinent Radiological findings (summarize): CT Cardiac Score  - feb 2020 IMPRESSION: 6 x 9 x 4 mm nodule in the superior segment of the left lower lobe and 4 mm nodule in the lateral left lower lobe. 8 mm nonenlarged precarinal lymph node identified. As the entire chest was not imaged on the calcium score study, an initial CT of the chest with IV contrast is recommended. This will better help to delineate follow-up recommendations or further imaging recommendations depending on detection of other nodules or lymphadenopathy.   Lab Results  Component Value Date   WBC 4.2 10/28/2021   HGB 14.9 10/28/2021   HCT 43.5 10/28/2021   PLT 198.0 10/28/2021   GLUCOSE 94 10/28/2021   CHOL 108 10/28/2021   TRIG 51.0 10/28/2021   HDL 51.60 10/28/2021   LDLDIRECT 104.1 05/15/2013   LDLCALC 47 10/28/2021   ALT 14 10/28/2021   AST 19 10/28/2021   NA 133 (L) 10/28/2021   K 3.9 10/28/2021   CL 95 (L) 10/28/2021   CREATININE 0.90 10/28/2021  BUN 12 10/28/2021   CO2 33 (H) 10/28/2021   TSH 2.60 10/28/2021   PSA 0.75 10/28/2021   Assessment/Plan:  Kyle Stafford is a 61 y.o. White or Caucasian [1] male with  has a past medical history of Hypertension, Lymphocytic colitis (02/20/2019), and Personal history of colonic adenomas (07/05/2013).  TOBACCO USE  DISORDER/SMOKER-SMOKING CESSATION DISCUSSED Pt counsled to quit smoking, pt not ready  Pulmonary nodule, left Small, ok for f/u CT chest now, and refer pulm also for enrollement in the yearly LDCT program to screen for lung ca  Essential hypertension BP Readings from Last 3 Encounters:  02/24/22 (!) 142/70  11/15/21 130/76  10/28/21 120/64   Mild uncontrolled, likely situational,, pt to continue medical treatment hyzaar 100 -12.5, toprol xl 25 qd though not really taking the latter       COPD exacerbation (HCC) Mild to mod, for depomedrol I'm 80 mg , also albuterol hfa prn, levaquin course, cough med prn, prednisone taper, and refer pulm as above  Chest pain Fleeting with cough, doubt cardiac related, pt declines ecg, cxr and lab f/u today  Followup: Return if symptoms worsen or fail to improve.  Cathlean Cower, MD 02/25/2022 1:01 PM [facility]

## 2022-02-24 NOTE — Patient Instructions (Signed)
You had the steroid shot today  Please take all new medication as prescribed - the antibiotic, prednisone, cough medicine, and inhaler as needed  Please continue all other medications as before  Please have the pharmacy call with any other refills you may need.  Please keep your appointments with your specialists as you may have planned  You will be contacted regarding the referral for: CT chest, and Pulmonary to get signed up for the yearly LDCT program (low dose CT scan program for yearly ct scan to screen for any changing nodules)  You are given the work note today  Please let us know if you change your mind about EKG, CXR or other lab testing today

## 2022-02-25 ENCOUNTER — Encounter: Payer: Self-pay | Admitting: Internal Medicine

## 2022-02-25 NOTE — Assessment & Plan Note (Signed)
Fleeting with cough, doubt cardiac related, pt declines ecg, cxr and lab f/u today

## 2022-02-25 NOTE — Assessment & Plan Note (Addendum)
BP Readings from Last 3 Encounters:  02/24/22 (!) 142/70  11/15/21 130/76  10/28/21 120/64   Mild uncontrolled, likely situational,, pt to continue medical treatment hyzaar 100 -12.5, toprol xl 25 qd though not really taking the latter

## 2022-02-25 NOTE — Assessment & Plan Note (Signed)
Pt counsled to quit smoking, pt not ready 

## 2022-02-25 NOTE — Assessment & Plan Note (Signed)
Mild to mod, for depomedrol I'm 80 mg , also albuterol hfa prn, levaquin course, cough med prn, prednisone taper, and refer pulm as above

## 2022-02-25 NOTE — Assessment & Plan Note (Signed)
Small, ok for f/u CT chest now, and refer pulm also for enrollement in the yearly LDCT program to screen for lung ca

## 2022-02-28 ENCOUNTER — Ambulatory Visit
Admission: RE | Admit: 2022-02-28 | Discharge: 2022-02-28 | Disposition: A | Discharge status: Home / Self Care | Payer: BC Managed Care – PPO | Source: Ambulatory Visit | Attending: Internal Medicine | Admitting: Internal Medicine

## 2022-02-28 DIAGNOSIS — R079 Chest pain, unspecified: Secondary | ICD-10-CM

## 2022-02-28 DIAGNOSIS — F17200 Nicotine dependence, unspecified, uncomplicated: Secondary | ICD-10-CM

## 2022-02-28 DIAGNOSIS — R911 Solitary pulmonary nodule: Secondary | ICD-10-CM

## 2022-02-28 DIAGNOSIS — J441 Chronic obstructive pulmonary disease with (acute) exacerbation: Secondary | ICD-10-CM

## 2022-05-09 ENCOUNTER — Ambulatory Visit (INDEPENDENT_AMBULATORY_CARE_PROVIDER_SITE_OTHER): Payer: BC Managed Care – PPO | Admitting: Cardiology

## 2022-05-09 ENCOUNTER — Encounter (HOSPITAL_BASED_OUTPATIENT_CLINIC_OR_DEPARTMENT_OTHER): Payer: Self-pay | Admitting: Cardiology

## 2022-05-09 VITALS — BP 128/70 | HR 65 | Ht 70.0 in | Wt 131.6 lb

## 2022-05-09 DIAGNOSIS — E7800 Pure hypercholesterolemia, unspecified: Secondary | ICD-10-CM | POA: Diagnosis not present

## 2022-05-09 DIAGNOSIS — I10 Essential (primary) hypertension: Secondary | ICD-10-CM

## 2022-05-09 DIAGNOSIS — I25118 Atherosclerotic heart disease of native coronary artery with other forms of angina pectoris: Secondary | ICD-10-CM | POA: Diagnosis not present

## 2022-05-09 DIAGNOSIS — Z720 Tobacco use: Secondary | ICD-10-CM

## 2022-05-09 DIAGNOSIS — Z7189 Other specified counseling: Secondary | ICD-10-CM

## 2022-05-09 NOTE — Patient Instructions (Signed)
Medication Instructions:  Your physician recommends that you continue on your current medications as directed. Please refer to the Current Medication list given to you today.   *If you need a refill on your cardiac medications before your next appointment, please call your pharmacy*  Lab Work: NONE  Testing/Procedures: NONE  Follow-Up: At [facility], you and your health needs are our priority.  As part of our continuing mission to provide you with exceptional heart care, we have created designated Provider Care Teams.  These Care Teams include your primary Cardiologist (physician) and Advanced Practice Providers (APPs -  Physician Assistants and Nurse Practitioners) who all work together to provide you with the care you need, when you need it.  We recommend signing up for the patient portal called "MyChart".  Sign up information is provided on this After Visit Summary.  MyChart is used to connect with patients for Virtual Visits (Telemedicine).  Patients are able to view lab/test results, encounter notes, upcoming appointments, etc.  Non-urgent messages can be sent to your provider as well.   To learn more about what you can do with MyChart, go to [email or web address].    Your next appointment:   12 month(s)  The format for your next appointment:   In Person  Provider:   Bridgette Christopher, MD        

## 2022-05-09 NOTE — Progress Notes (Signed)
Cardiology Office Note:    Date:  05/09/2022   ID:  Kyle Stafford, DOB [birth year], MRN [id number]  PCP:  Cassandria Anger, MD  Cardiologist:  Buford Dresser, MD PhD  Referring MD: Cassandria Anger, MD   CC: follow up  History of Present Illness:    Kyle Stafford is a 61 y.o. male with a hx of hypertension, hyperlipidemia, tobacco abuse, CAD detected by coronary calcium score, here for follow up.  Cardiac history: He had a CT cardiac score on 10/16/18 which showed LM/3V calcification with a score of 1416 (99th percentile). His most recent lipids from 09/25/18 noted Tchol 160, TG 133, HDL 34, LDL 99, ratio of 5. This was prior to being started on atorvastatin 10 mg. Risk factors include continued tobacco use. Adopted, no known FH.   At his last appointment, he was feeling okay aside from constant fatigue and little interest in doing anything. Also, he felt like his blood pressure medication was not as effective lately. He was not monitoring his BP at home, but he felt like it was running too high.   He endorsed shortness of breath, as well as chest pain that he was more accustomed to. Also, he complained of urinary urgency when he would begin to exercise, or otherwise after exertion. He was not sure if this would decrease if he continued after using the restroom. Of note, he seemed to be more adjusted to his HCTZ, as this did not cause as much urinary frequency. Sometimes at work he was limited by his symptoms. He stated his main limiting symptom was shortness of breath.  He complained of frequent congestion and rhinorrhea. Taking claritin had not been helpful for him.  During work, he would typically walks 6-8 miles per shift.   He was still smoking, not interested in quitting.  Today:  He states he has not been feeling good. The heat has been causing him to "see blue." He experiences this when he exerts himself or gets up from a sitting position. He endorses he notices  it more on hot days and does not feel able to do much on those days.  He has been using his inhaler about twice a day, which improves his shortness of breath symptoms. Additionally, he mentions having a productive cough at times.  The most extreme physical activity he believes he's had in the past month has involved swinging a hammer. He tries not to push himself physically.  He states that he mainly drinks soda, not much water. He has cut a lot of his food portions down. He generally eats one good meal a day, which seems to work for him. He states he has lost around 25 lbs.  He is still smoking, not interested in cessation.  He denies any palpitations, chest pain, or peripheral edema. No lightheadedness, headaches, syncope, orthopnea, or PND.   Past Medical History:  Diagnosis Date   Hypertension    Lymphocytic colitis 02/20/2019   Personal history of colonic adenomas 07/05/2013    Past Surgical History:  Procedure Laterality Date   EYE SURGERY     as a toddler   knee injury     piece of steel removed    Current Medications: Current Outpatient Medications on File Prior to Visit  Medication Sig   albuterol (VENTOLIN HFA) 108 (90 Base) MCG/ACT inhaler Inhale 2 puffs into the lungs every 6 (six) hours as needed for wheezing or shortness of breath.   ALPRAZolam (NIRAVAM) 0.5 MG  dissolvable tablet Take 1 tablet (0.5 mg total) by mouth 2 (two) times daily as needed for anxiety.   aspirin EC 81 MG tablet Take 81 mg by mouth daily.   atorvastatin (LIPITOR) 40 MG tablet TAKE 1 TABLET DAILY   b complex vitamins tablet Take 1 tablet by mouth daily.   Cholecalciferol (VITAMIN D3) 2000 units capsule Take 1 capsule (2,000 Units total) by mouth daily.   fluticasone (FLONASE) 50 MCG/ACT nasal spray Place into both nostrils as needed for allergies or rhinitis.   loratadine (CLARITIN) 10 MG tablet Take 1 tablet (10 mg total) by mouth daily.   losartan-hydrochlorothiazide (HYZAAR) 100-12.5 MG  tablet TAKE 1 TABLET DAILY   Melatonin-Pyridoxine (MELATIN PO) Take by mouth as needed.   metoprolol succinate (TOPROL XL) 25 MG 24 hr tablet Take 1 tablet (25 mg total) by mouth daily. (Patient not taking: Reported on 02/24/2022)   montelukast (SINGULAIR) 10 MG tablet Take 1 tablet (10 mg total) by mouth daily.   predniSONE (DELTASONE) 10 MG tablet 3 tabs by mouth per day for 3 days,2tabs per day for 3 days,1tab per day for 3 days   No current facility-administered medications on file prior to visit.     Allergies:   Bupropion hcl and Penicillins   Social History   Tobacco Use   Smoking status: Every Day    Packs/day: 1.50    Types: Cigarettes   Smokeless tobacco: Never  Vaping Use   Vaping Use: Never used  Substance Use Topics   Alcohol use: Yes    Alcohol/week: 21.0 standard drinks of alcohol    Types: 21 Cans of beer per week    Comment: 2 nights a week   Drug use: No     Family History: The patient is adopted and does not know his biological family history.  ROS:   Please see the history of present illness.   (+) Blue vision (+) Shortness of breath (+) Productive cough (+) Fatigue  Additional pertinent ROS otherwise unremarkable.   EKGs/Labs/Other Studies Reviewed:    The following studies were reviewed today:  CT Chest 02/28/2022:  FINDINGS: Cardiovascular: Multivessel coronary artery calcification. Mild scattered calcific atherosclerosis of the thoracic aorta. Normal heart size. No pericardial effusion.   Mediastinum/Nodes: No enlarged mediastinal or axillary lymph nodes. Thyroid gland, trachea, and esophagus demonstrate no significant findings.   Lungs/Pleura: Upper lobe predominant mixed centrilobular and paraseptal emphysema. A 0.4 cm solid noncalcified nodule in the lateral right lower lobe is unchanged compared to 10/16/2018 (series 3, image 134). A 0.9 cm noncalcified nodule in the left lower lobe is also stable compared to 10/16/2018 (series 3,  image 112). Additional 0.3 cm solid nodules in the upper lobes are also stable compared to prior exam (series 3, images 70, 103). No pleural effusion or pneumothorax.   Upper Abdomen: No acute abnormality.   Musculoskeletal: No chest wall mass or suspicious bone lesions identified.   IMPRESSION: 1. Multiple bilateral pulmonary nodules are stable compared to 10/16/2018, consistent with benignity. No additional dedicated follow-up imaging is indicated. 2. Aortic Atherosclerosis (ICD10-I70.0) and Emphysema (ICD10-J43.9). 3. Multivessel coronary artery disease.   CT cardiac score 10/16/2018: FINDINGS: Non-cardiac: See separate report from [facility].   Ascending aorta: Normal diameter 2.9 cm   Pericardium: Normal   Coronary arteries: Marked LM and 3 vessel coronary calcium   IMPRESSION: Coronary calcium score of 1416. This was 20 th percentile for age and sex matched control.   Given the extent of calcium consider  f/u perfusion study   See radiology note regarding pulmonary findings  EKG:  EKG is personally reviewed.   05/09/22: not ordered today 11/15/2021: NSR at 72 bpm, biatrial enlargement, pulmonary pattern 05/08/20: sinus bradycardia at 55 bpm  Recent Labs: 10/28/2021: ALT 14; BUN 12; Creatinine, Ser 0.90; Hemoglobin 14.9; Platelets 198.0; Potassium 3.9; Sodium 133; TSH 2.60   Recent Lipid Panel    Component Value Date/Time   CHOL 108 10/28/2021 1003   CHOL 121 02/19/2019 1013   TRIG 51.0 10/28/2021 1003   HDL 51.60 10/28/2021 1003   HDL 39 (L) 02/19/2019 1013   CHOLHDL 2 10/28/2021 1003   VLDL 10.2 10/28/2021 1003   LDLCALC 47 10/28/2021 1003   LDLCALC 62 02/19/2019 1013   LDLDIRECT 104.1 05/15/2013 1301    Physical Exam:    VS:  BP 128/70 (BP Location: Right Arm, Patient Position: Sitting, Cuff Size: Normal)   Pulse 65   Ht '5\' 10"'$  (1.778 m)   Wt 131 lb 9.6 oz (59.7 kg)   SpO2 95%   BMI 18.88 kg/m     Wt Readings from Last 3 Encounters:   05/09/22 131 lb 9.6 oz (59.7 kg)  02/24/22 132 lb 2 oz (59.9 kg)  11/15/21 134 lb 3.2 oz (60.9 kg)    GEN: Well nourished, well developed in no acute distress HEENT: Normal, moist mucous membranes NECK: No JVD CARDIAC: regular rhythm, normal S1 and S2, no rubs or gallops. No murmur. VASCULAR: Radial and DP pulses 2+ bilaterally. No carotid bruits RESPIRATORY:  somewhat distant breath sounds, mild expiratory wheezing, no rales, or rhonchi  ABDOMEN: Soft, non-tender, non-distended MUSCULOSKELETAL:  Ambulates independently SKIN: Warm and dry, no edema NEUROLOGIC:  Alert and oriented x 3. No focal neuro deficits noted. PSYCHIATRIC:  Normal affect    ASSESSMENT:    1. Coronary artery disease of native artery of native heart with stable angina pectoris ([place])   2. Pure hypercholesterolemia   3. Essential hypertension   4. Tobacco abuse   5. Cardiac risk counseling    PLAN:    CAD, with stable angina ([place]) Hypercholesterolemia -stable shortness of breath with exertion, denies chest pain -reviewed red flag warning signs that need immediate medical attention -we discussed antianginals. Did not tolerate metoprolol due to mood side effects -he is tolerating atorvastatin 40 mg. Reviewed goal of LDL <70, last LDL 47 -discussed importance of tobacco cessation. He is not interested in quitting at this time -on aspirin 81 mg  Hypertension: at goal today -he self-discontinued amlodipine due to lightheadedness. -continue losartan-HCTZ 100/12.5 mg daily  Cardiac risk counseling and prevention recommendations: -recommend heart healthy/Mediterranean diet, with whole grains, fruits, vegetable, fish, lean meats, nuts, and olive oil. Limit salt. -recommend moderate walking, 3-5 times/week for 30-50 minutes each session. Aim for at least 150 minutes.week. Goal should be pace of 3 miles/hours, or walking 1.5 miles in 30 minutes -recommend avoidance of tobacco products. Avoid excess alcohol.  Plan  for follow up: 1 year or sooner PRN  Medication Adjustments/Labs and Tests Ordered: Current medicines are reviewed at length with the patient today.  Concerns regarding medicines are outlined above.   No orders of the defined types were placed in this encounter.  No orders of the defined types were placed in this encounter.  Patient Instructions  Medication Instructions:  Your physician recommends that you continue on your current medications as directed. Please refer to the Current Medication list given to you today.   *If you need a refill on  your cardiac medications before your next appointment, please call your pharmacy*  Lab Work: NONE  Testing/Procedures: NONE  Follow-Up: At [facility], you and your health needs are our priority.  As part of our continuing mission to provide you with exceptional heart care, we have created designated Provider Care Teams.  These Care Teams include your primary Cardiologist (physician) and Advanced Practice Providers (APPs -  Physician Assistants and Nurse Practitioners) who all work together to provide you with the care you need, when you need it.  We recommend signing up for the patient portal called "MyChart".  Sign up information is provided on this After Visit Summary.  MyChart is used to connect with patients for Virtual Visits (Telemedicine).  Patients are able to view lab/test results, encounter notes, upcoming appointments, etc.  Non-urgent messages can be sent to your provider as well.   To learn more about what you can do with MyChart, go to [email or web address].    Your next appointment:   12 month(s)  The format for your next appointment:   In Person  Provider:   Buford Dresser, MD             I,Breanna Adamick,acting as a scribe for Buford Dresser, MD.,have documented all relevant documentation on the behalf of Buford Dresser, MD,as directed by  Buford Dresser, MD while in the  presence of Buford Dresser, MD.   I, Buford Dresser, MD, have reviewed all documentation for this visit. The documentation on 05/09/22 for the exam, diagnosis, procedures, and orders are all accurate and complete.   Signed, Buford Dresser, MD PhD 05/09/2022  [place]

## 2022-05-29 ENCOUNTER — Encounter (HOSPITAL_BASED_OUTPATIENT_CLINIC_OR_DEPARTMENT_OTHER): Payer: Self-pay | Admitting: Cardiology

## 2023-01-12 ENCOUNTER — Telehealth: Payer: Self-pay | Admitting: Internal Medicine

## 2023-01-12 DIAGNOSIS — I10 Essential (primary) hypertension: Secondary | ICD-10-CM

## 2023-01-12 DIAGNOSIS — I2510 Atherosclerotic heart disease of native coronary artery without angina pectoris: Secondary | ICD-10-CM

## 2023-01-12 MED ORDER — ATORVASTATIN CALCIUM 40 MG PO TABS: 40.0000 mg | ORAL_TABLET | Freq: Every day | ORAL | 0 refills | Status: DC

## 2023-01-12 MED ORDER — LOSARTAN POTASSIUM-HCTZ 100-12.5 MG PO TABS: 1.0000 | ORAL_TABLET | Freq: Every day | ORAL | 0 refills | Status: DC

## 2023-01-12 NOTE — Telephone Encounter (Signed)
Notified pt sent [facility] day to [facility] until appt.Marland KitchenRaechel Chute

## 2023-01-12 NOTE — Telephone Encounter (Signed)
Patient has an appointment scheduled for 02/06/23, but he will run out of atorvastatin (LIPITOR) 40 MG tablet and losartan-hydrochlorothiazide (HYZAAR) 100-12.5 MG tablet prior to the appointment. He would like to get a partial refill sent to [facility] [place], [place] [facility] [address] [facility] AT Portal to Registered [employer] Sites so he can continue taking his medicine. Best callback is [phone].

## 2023-02-04 ENCOUNTER — Other Ambulatory Visit: Payer: Self-pay | Admitting: Internal Medicine

## 2023-02-04 DIAGNOSIS — I10 Essential (primary) hypertension: Secondary | ICD-10-CM

## 2023-02-04 DIAGNOSIS — I2510 Atherosclerotic heart disease of native coronary artery without angina pectoris: Secondary | ICD-10-CM

## 2023-02-06 ENCOUNTER — Encounter: Payer: Self-pay | Admitting: Internal Medicine

## 2023-02-06 ENCOUNTER — Ambulatory Visit (INDEPENDENT_AMBULATORY_CARE_PROVIDER_SITE_OTHER): Payer: BC Managed Care – PPO | Admitting: Internal Medicine

## 2023-02-06 ENCOUNTER — Ambulatory Visit (INDEPENDENT_AMBULATORY_CARE_PROVIDER_SITE_OTHER): Payer: BC Managed Care – PPO

## 2023-02-06 VITALS — BP 132/64 | HR 66 | Temp 97.8°F | Ht 70.0 in | Wt 137.5 lb

## 2023-02-06 DIAGNOSIS — J441 Chronic obstructive pulmonary disease with (acute) exacerbation: Secondary | ICD-10-CM

## 2023-02-06 DIAGNOSIS — F17200 Nicotine dependence, unspecified, uncomplicated: Secondary | ICD-10-CM

## 2023-02-06 DIAGNOSIS — I2510 Atherosclerotic heart disease of native coronary artery without angina pectoris: Secondary | ICD-10-CM

## 2023-02-06 DIAGNOSIS — Z0000 Encounter for general adult medical examination without abnormal findings: Secondary | ICD-10-CM

## 2023-02-06 DIAGNOSIS — Z0001 Encounter for general adult medical examination with abnormal findings: Secondary | ICD-10-CM

## 2023-02-06 DIAGNOSIS — R062 Wheezing: Secondary | ICD-10-CM | POA: Diagnosis not present

## 2023-02-06 DIAGNOSIS — I10 Essential (primary) hypertension: Secondary | ICD-10-CM

## 2023-02-06 MED ORDER — TRELEGY ELLIPTA 100-62.5-25 MCG/ACT IN AEPB: 1.0000 | INHALATION_SPRAY | Freq: Every day | RESPIRATORY_TRACT | 11 refills | Status: DC

## 2023-02-06 MED ORDER — TADALAFIL 20 MG PO TABS: 20.0000 mg | ORAL_TABLET | Freq: Every day | ORAL | 3 refills | Status: AC | PRN

## 2023-02-06 MED ORDER — ALPRAZOLAM 0.5 MG PO TABS: 0.5000 mg | ORAL_TABLET | Freq: Every evening | ORAL | 1 refills | Status: DC | PRN

## 2023-02-06 NOTE — Assessment & Plan Note (Signed)
Start Trelegy qd

## 2023-02-06 NOTE — Progress Notes (Signed)
Subjective:  Patient ID: Kyle Stafford, male    DOB: 1960-09-03  Age: 62 y.o. MRN: [id number]  CC: No chief complaint on file.   HPI Kyle Stafford presents for a well exam C/o cough x long time, 1 ppd smoker  Outpatient Medications Prior to Visit  Medication Sig Dispense Refill   albuterol (VENTOLIN HFA) 108 (90 Base) MCG/ACT inhaler Inhale 2 puffs into the lungs every 6 (six) hours as needed for wheezing or shortness of breath. 8 g 5   aspirin EC 81 MG tablet Take 81 mg by mouth daily.     atorvastatin (LIPITOR) 40 MG tablet Take 1 tablet (40 mg total) by mouth daily. Keep June appt for future refills 30 tablet 0   b complex vitamins tablet Take 1 tablet by mouth daily. 100 tablet 3   Cholecalciferol (VITAMIN D3) 2000 units capsule Take 1 capsule (2,000 Units total) by mouth daily. 100 capsule 3   fluticasone (FLONASE) 50 MCG/ACT nasal spray Place into both nostrils as needed for allergies or rhinitis.     loratadine (CLARITIN) 10 MG tablet Take 1 tablet (10 mg total) by mouth daily. 90 tablet 3   losartan-hydrochlorothiazide (HYZAAR) 100-12.5 MG tablet Take 1 tablet by mouth daily. Keep June appt for future refills 30 tablet 0   Melatonin-Pyridoxine (MELATIN PO) Take by mouth as needed.     ALPRAZolam (NIRAVAM) 0.5 MG dissolvable tablet Take 1 tablet (0.5 mg total) by mouth 2 (two) times daily as needed for anxiety. 60 tablet 3   montelukast (SINGULAIR) 10 MG tablet Take 1 tablet (10 mg total) by mouth daily. 90 tablet 3   No facility-administered medications prior to visit.    ROS: Review of Systems  Constitutional:  Negative for appetite change, fatigue and unexpected weight change.  HENT:  Negative for congestion, nosebleeds, sneezing, sore throat and trouble swallowing.   Eyes:  Negative for itching and visual disturbance.  Respiratory:  Positive for cough.   Cardiovascular:  Negative for chest pain, palpitations and leg swelling.  Gastrointestinal:  Negative for  abdominal distention, blood in stool, diarrhea and nausea.  Genitourinary:  Negative for frequency and hematuria.  Musculoskeletal:  Negative for back pain, gait problem, joint swelling and neck pain.  Skin:  Negative for rash.  Neurological:  Negative for dizziness, tremors, speech difficulty and weakness.  Psychiatric/Behavioral:  Negative for agitation, dysphoric mood and sleep disturbance. The patient is not nervous/anxious.     Objective:  BP 132/64   Pulse 66   Temp 97.8 F (36.6 C) (Temporal)   Ht 5\' 10"  (1.778 m)   Wt 137 lb 8 oz (62.4 kg)   SpO2 95%   BMI 19.73 kg/m   BP Readings from Last 3 Encounters:  02/06/23 132/64  05/09/22 128/70  02/24/22 (!) 142/70    Wt Readings from Last 3 Encounters:  02/06/23 137 lb 8 oz (62.4 kg)  05/09/22 131 lb 9.6 oz (59.7 kg)  02/24/22 132 lb 2 oz (59.9 kg)    Physical Exam Constitutional:      General: He is not in acute distress.    Appearance: He is well-developed.     Comments: NAD  Eyes:     Conjunctiva/sclera: Conjunctivae normal.     Pupils: Pupils are equal, round, and reactive to light.  Neck:     Thyroid: No thyromegaly.     Vascular: No JVD.  Cardiovascular:     Rate and Rhythm: Normal rate and regular rhythm.  Heart sounds: Normal heart sounds. No murmur heard.    No friction rub. No gallop.  Pulmonary:     Effort: Pulmonary effort is normal. No respiratory distress.     Breath sounds: Wheezing present. No rales.  Chest:     Chest wall: No tenderness.  Abdominal:     General: Bowel sounds are normal. There is no distension.     Palpations: Abdomen is soft. There is no mass.     Tenderness: There is no abdominal tenderness. There is no guarding or rebound.  Musculoskeletal:        General: No tenderness. Normal range of motion.     Cervical back: Normal range of motion.  Lymphadenopathy:     Cervical: No cervical adenopathy.  Skin:    General: Skin is warm and dry.     Findings: No rash.   Neurological:     Mental Status: He is alert and oriented to person, place, and time.     Cranial Nerves: No cranial nerve deficit.     Motor: No abnormal muscle tone.     Coordination: Coordination normal.     Gait: Gait normal.     Deep Tendon Reflexes: Reflexes are normal and symmetric.  Psychiatric:        Behavior: Behavior normal.        Thought Content: Thought content normal.        Judgment: Judgment normal.   B wheezing Thin Pt declined rectal exam  Lab Results  Component Value Date   WBC 4.2 10/28/2021   HGB 14.9 10/28/2021   HCT 43.5 10/28/2021   PLT 198.0 10/28/2021   GLUCOSE 94 10/28/2021   CHOL 108 10/28/2021   TRIG 51.0 10/28/2021   HDL 51.60 10/28/2021   LDLDIRECT 104.1 05/15/2013   LDLCALC 47 10/28/2021   ALT 14 10/28/2021   AST 19 10/28/2021   NA 133 (L) 10/28/2021   K 3.9 10/28/2021   CL 95 (L) 10/28/2021   CREATININE 0.90 10/28/2021   BUN 12 10/28/2021   CO2 33 (H) 10/28/2021   TSH 2.60 10/28/2021   PSA 0.75 10/28/2021    CT Chest Wo Contrast  Result Date: 03/01/2022 CLINICAL DATA:  Evaluate left lung nodules noted in the left lower lobe on ct calcium scoring 10/16/2018. History of COPD. EXAM: CT CHEST WITHOUT CONTRAST TECHNIQUE: Multidetector CT imaging of the chest was performed following the standard protocol without IV contrast. RADIATION DOSE REDUCTION: This exam was performed according to the departmental dose-optimization program which includes automated exposure control, adjustment of the mA and/or kV according to patient size and/or use of iterative reconstruction technique. COMPARISON:  CT hard scoring 10/16/2018. FINDINGS: Cardiovascular: Multivessel coronary artery calcification. Mild scattered calcific atherosclerosis of the thoracic aorta. Normal heart size. No pericardial effusion. Mediastinum/Nodes: No enlarged mediastinal or axillary lymph nodes. Thyroid gland, trachea, and esophagus demonstrate no significant findings. Lungs/Pleura:  Upper lobe predominant mixed centrilobular and paraseptal emphysema. A 0.4 cm solid noncalcified nodule in the lateral right lower lobe is unchanged compared to 10/16/2018 (series 3, image 134). A 0.9 cm noncalcified nodule in the left lower lobe is also stable compared to 10/16/2018 (series 3, image 112). Additional 0.3 cm solid nodules in the upper lobes are also stable compared to prior exam (series 3, images 70, 103). No pleural effusion or pneumothorax. Upper Abdomen: No acute abnormality. Musculoskeletal: No chest wall mass or suspicious bone lesions identified. IMPRESSION: 1. Multiple bilateral pulmonary nodules are stable compared to 10/16/2018, consistent with benignity.  No additional dedicated follow-up imaging is indicated. 2. Aortic Atherosclerosis (ICD10-I70.0) and Emphysema (ICD10-J43.9). 3. Multivessel coronary artery disease. Electronically Signed   By: Sherron Ales M.D.   On: 03/01/2022 22:04    Assessment & Plan:   Problem List Items Addressed This Visit     TOBACCO USE DISORDER/SMOKER-SMOKING CESSATION DISCUSSED    1 ppd Discussed      Essential hypertension    Dr Cristal Deer Coronary calcium score of 1416 - 2020 On Atorvastatin, ASA      Relevant Medications   tadalafil (CIALIS) 20 MG tablet   Other Relevant Orders   TSH   Urinalysis   CBC with Differential/Platelet   Lipid panel   PSA   Comprehensive metabolic panel   Wheezing    Start Trelegy qd      Well adult exam - Primary    We discussed age appropriate health related issues, including available/recomended screening tests and vaccinations. Labs were ordered to be later reviewed . All questions were answered. We discussed one or more of the following - seat belt use, use of sunscreen/sun exposure exercise, safe sex, fall risk reduction, second hand smoke exposure, firearm use and storage, seat belt use, a need for adhering to healthy diet and exercise. Labs were ordered.  All questions were answered. On ASA,  Lipitor 2 ppd smoker - needs to cut back and stop. Prevnar offered Dr Leone Payor; Colonoscopy 2014 due in 2024  Pt declined a rectal exam 1/19      Relevant Orders   TSH   Urinalysis   CBC with Differential/Platelet   Lipid panel   PSA   Comprehensive metabolic panel   Coronary artery disease involving native coronary artery of native heart without angina pectoris    1 ppd in 2024      Relevant Medications   tadalafil (CIALIS) 20 MG tablet   COPD exacerbation (HCC)    CXR if not better      Relevant Medications   Fluticasone-Umeclidin-Vilant (TRELEGY ELLIPTA) 100-62.5-25 MCG/ACT AEPB   Other Relevant Orders   DG Chest 2 View      Meds ordered this encounter  Medications   Fluticasone-Umeclidin-Vilant (TRELEGY ELLIPTA) 100-62.5-25 MCG/ACT AEPB    Sig: Inhale 1 puff into the lungs daily.    Dispense:  1 each    Refill:  11   ALPRAZolam (XANAX) 0.5 MG tablet    Sig: Take 1 tablet (0.5 mg total) by mouth at bedtime as needed for anxiety or sleep.    Dispense:  90 tablet    Refill:  1   tadalafil (CIALIS) 20 MG tablet    Sig: Take 1 tablet (20 mg total) by mouth daily as needed for erectile dysfunction.    Dispense:  20 tablet    Refill:  3      Follow-up: No follow-ups on file.  Sonda Primes, MD

## 2023-02-06 NOTE — Assessment & Plan Note (Signed)
1 ppd Discussed  

## 2023-02-06 NOTE — Assessment & Plan Note (Signed)
CXR if not better 

## 2023-02-06 NOTE — Assessment & Plan Note (Signed)
1 ppd in 2024

## 2023-02-06 NOTE — Assessment & Plan Note (Signed)
Dr Cristal Deer Coronary calcium score of 1416 - 2020 On Atorvastatin, ASA

## 2023-02-06 NOTE — Assessment & Plan Note (Addendum)
We discussed age appropriate health related issues, including available/recomended screening tests and vaccinations. Labs were ordered to be later reviewed . All questions were answered. We discussed one or more of the following - seat belt use, use of sunscreen/sun exposure exercise, safe sex, fall risk reduction, second hand smoke exposure, firearm use and storage, seat belt use, a need for adhering to healthy diet and exercise. Labs were ordered.  All questions were answered. On ASA, Lipitor 2 ppd smoker - needs to cut back and stop. Prevnar offered Dr Leone Payor; Colonoscopy 2014 due in 2024  Pt declined a rectal exam 1/19

## 2023-02-13 ENCOUNTER — Other Ambulatory Visit (INDEPENDENT_AMBULATORY_CARE_PROVIDER_SITE_OTHER): Payer: BC Managed Care – PPO

## 2023-02-13 DIAGNOSIS — I10 Essential (primary) hypertension: Secondary | ICD-10-CM | POA: Diagnosis not present

## 2023-02-13 DIAGNOSIS — Z0000 Encounter for general adult medical examination without abnormal findings: Secondary | ICD-10-CM

## 2023-02-13 DIAGNOSIS — Z125 Encounter for screening for malignant neoplasm of prostate: Secondary | ICD-10-CM | POA: Diagnosis not present

## 2023-02-13 LAB — CBC WITH DIFFERENTIAL/PLATELET
Basophils Absolute: 0 10*3/uL (ref 0.0–0.1)
Basophils Relative: 0.5 % (ref 0.0–3.0)
Eosinophils Absolute: 0.1 10*3/uL (ref 0.0–0.7)
Eosinophils Relative: 2.5 % (ref 0.0–5.0)
HCT: 46.1 % (ref 39.0–52.0)
Hemoglobin: 15.3 g/dL (ref 13.0–17.0)
Lymphocytes Relative: 31.2 % (ref 12.0–46.0)
Lymphs Abs: 1.4 10*3/uL (ref 0.7–4.0)
MCHC: 33.3 g/dL (ref 30.0–36.0)
MCV: 98.7 fl (ref 78.0–100.0)
Monocytes Absolute: 0.5 10*3/uL (ref 0.1–1.0)
Monocytes Relative: 10.4 % (ref 3.0–12.0)
Neutro Abs: 2.5 10*3/uL (ref 1.4–7.7)
Neutrophils Relative %: 55.4 % (ref 43.0–77.0)
Platelets: 227 10*3/uL (ref 150.0–400.0)
RBC: 4.67 Mil/uL (ref 4.22–5.81)
RDW: 12.7 % (ref 11.5–15.5)
WBC: 4.5 10*3/uL (ref 4.0–10.5)

## 2023-02-13 LAB — COMPREHENSIVE METABOLIC PANEL
ALT: 11 U/L (ref 0–53)
AST: 18 U/L (ref 0–37)
Albumin: 4.3 g/dL (ref 3.5–5.2)
Alkaline Phosphatase: 67 U/L (ref 39–117)
BUN: 11 mg/dL (ref 6–23)
CO2: 34 mEq/L — ABNORMAL HIGH (ref 19–32)
Calcium: 9.8 mg/dL (ref 8.4–10.5)
Chloride: 96 mEq/L (ref 96–112)
Creatinine, Ser: 0.94 mg/dL (ref 0.40–1.50)
GFR: 88.34 mL/min (ref >60.00)
Glucose, Bld: 104 mg/dL — ABNORMAL HIGH (ref 70–99)
Potassium: 4.8 mEq/L (ref 3.5–5.1)
Sodium: 135 mEq/L (ref 135–145)
Total Bilirubin: 0.5 mg/dL (ref 0.2–1.2)
Total Protein: 7.5 g/dL (ref 6.0–8.3)

## 2023-02-13 LAB — LIPID PANEL
Cholesterol: 111 mg/dL (ref 0–200)
HDL: 39.3 mg/dL (ref >39.00)
LDL Cholesterol: 58 mg/dL (ref 0–99)
NonHDL: 71.5
Total CHOL/HDL Ratio: 3
Triglycerides: 69 mg/dL (ref 0.0–149.0)
VLDL: 13.8 mg/dL (ref 0.0–40.0)

## 2023-02-13 LAB — URINALYSIS
Bilirubin Urine: NEGATIVE
Hgb urine dipstick: NEGATIVE
Ketones, ur: NEGATIVE
Leukocytes,Ua: NEGATIVE
Nitrite: NEGATIVE
Specific Gravity, Urine: <=1.005 — AB (ref 1.000–1.030)
Total Protein, Urine: NEGATIVE
Urine Glucose: NEGATIVE
Urobilinogen, UA: 0.2 (ref 0.0–1.0)
pH: 7 (ref 5.0–8.0)

## 2023-02-13 LAB — TSH: TSH: 3.03 u[IU]/mL (ref 0.35–5.50)

## 2023-02-13 LAB — PSA: PSA: 1.38 ng/mL (ref 0.10–4.00)

## 2023-02-16 ENCOUNTER — Telehealth: Payer: Self-pay | Admitting: Internal Medicine

## 2023-02-16 DIAGNOSIS — I2510 Atherosclerotic heart disease of native coronary artery without angina pectoris: Secondary | ICD-10-CM

## 2023-02-16 DIAGNOSIS — I10 Essential (primary) hypertension: Secondary | ICD-10-CM

## 2023-02-16 MED ORDER — ATORVASTATIN CALCIUM 40 MG PO TABS
40.0000 mg | ORAL_TABLET | Freq: Every day | ORAL | 3 refills | Status: DC
Start: 2023-02-16 — End: 2024-05-12

## 2023-02-16 MED ORDER — LOSARTAN POTASSIUM-HCTZ 100-12.5 MG PO TABS
1.0000 | ORAL_TABLET | Freq: Every day | ORAL | 3 refills | Status: DC
Start: 2023-02-16 — End: 2024-05-12

## 2023-02-16 NOTE — Telephone Encounter (Signed)
Prescription Request  02/16/2023  LOV: 02/06/2023  What is the name of the medication or equipment? atorvastatin (LIPITOR) 40 MG tablet  losartan-hydrochlorothiazide (HYZAAR) 100-12.5 MG tablet   Have you contacted your pharmacy to request a refill? Yes   Which pharmacy would you like this sent to?  [facility] - [place], [place] - [facility] AT Portal to Registered Caremark Sites [address] [place] [address] Phone: [phone] Fax: [phone]    Patient notified that their request is being sent to the clinical staff for review and that they should receive a response within 2 business days.   Please advise at Mobile [phone] (mobile)

## 2023-02-16 NOTE — Telephone Encounter (Signed)
[facility]ills [facility] to [facility].Marland KitchenRaechel Chute

## 2023-04-13 ENCOUNTER — Telehealth: Payer: Self-pay | Admitting: Internal Medicine

## 2023-04-13 MED ORDER — TRELEGY ELLIPTA 100-62.5-25 MCG/ACT IN AEPB: 1.0000 | INHALATION_SPRAY | Freq: Every day | RESPIRATORY_TRACT | 8 refills | Status: DC

## 2023-04-13 NOTE — Telephone Encounter (Signed)
Notified pt [facility]

## 2023-04-13 NOTE — Telephone Encounter (Signed)
Pt called wanting Trelegy prescribes monthly instead of a 90 day supply due to insurance reasons. Please advise.

## 2023-04-13 NOTE — Telephone Encounter (Signed)
Error

## 2023-07-03 ENCOUNTER — Encounter (HOSPITAL_BASED_OUTPATIENT_CLINIC_OR_DEPARTMENT_OTHER): Payer: Self-pay | Admitting: Cardiology

## 2023-07-03 ENCOUNTER — Ambulatory Visit (HOSPITAL_BASED_OUTPATIENT_CLINIC_OR_DEPARTMENT_OTHER): Payer: BC Managed Care – PPO | Admitting: Cardiology

## 2023-07-03 VITALS — BP 132/70 | Ht 70.0 in | Wt 149.0 lb

## 2023-07-03 DIAGNOSIS — E7800 Pure hypercholesterolemia, unspecified: Secondary | ICD-10-CM | POA: Diagnosis not present

## 2023-07-03 DIAGNOSIS — I10 Essential (primary) hypertension: Secondary | ICD-10-CM | POA: Diagnosis not present

## 2023-07-03 DIAGNOSIS — Z720 Tobacco use: Secondary | ICD-10-CM | POA: Diagnosis not present

## 2023-07-03 DIAGNOSIS — I25118 Atherosclerotic heart disease of native coronary artery with other forms of angina pectoris: Secondary | ICD-10-CM

## 2023-07-03 DIAGNOSIS — Z7189 Other specified counseling: Secondary | ICD-10-CM

## 2023-07-03 NOTE — Progress Notes (Signed)
Cardiology Office Note:  .   Date:  07/03/2023  ID:  Kyle Stafford, DOB September 08, 1960, MRN [id number] PCP: Tresa Garter, MD  [facility] Providers Cardiologist:  Jodelle Red, MD {  History of Present Illness: .   Kyle Stafford is a 62 y.o. male with a hx of hypertension, hyperlipidemia, tobacco abuse, CAD detected by coronary calcium score, here for follow up.   Cardiac history: He had a CT cardiac score on 10/16/18 which showed LM/3V calcification with a score of 1416 (99th percentile). His most recent lipids from 09/25/18 noted Tchol 160, TG 133, HDL 34, LDL 99, ratio of 5. This was prior to being started on atorvastatin 10 mg. Risk factors include continued tobacco use. Adopted, no known FH.   Today: Doing well overall. Has shortness of breath with exertion, especially with heavy lifting. Doesn't get short of breath with walking on flat surfaces, but limited by back and upper leg pain. Has a history of sciatica.  ROS: Denies chest pain, shortness of breath at rest. No PND, orthopnea, LE edema or unexpected weight gain. No syncope or palpitations. ROS otherwise negative except as noted.   Studies Reviewed: Marland Kitchen    EKG:  EKG Interpretation Date/Time:  Monday July 03 2023 15:05:40 EST Ventricular Rate:  65 PR Interval:  154 QRS Duration:  88 QT Interval:  394 QTC Calculation: 409 R Axis:   88  Text Interpretation: Normal sinus rhythm Normal ECG Confirmed by Jodelle Red [phone]) on 07/03/2023 3:26:57 PM    Physical Exam:   VS:  BP 132/70   Ht 5\' 10"  (1.778 m)   Wt 149 lb (67.6 kg)   SpO2 96%   BMI 21.38 kg/m    Wt Readings from Last 3 Encounters:  07/03/23 149 lb (67.6 kg)  02/06/23 137 lb 8 oz (62.4 kg)  05/09/22 131 lb 9.6 oz (59.7 kg)    GEN: Well nourished, well developed in no acute distress HEENT: Normal, moist mucous membranes NECK: No JVD CARDIAC: regular rhythm, normal S1 and S2, no rubs or gallops. No murmur. VASCULAR: Radial  and DP pulses 2+ bilaterally. No carotid bruits RESPIRATORY:  Distant breath sounds bilaterally with end expiratory wheezing ABDOMEN: Soft, non-tender, non-distended MUSCULOSKELETAL:  Ambulates independently SKIN: Warm and dry, no edema NEUROLOGIC:  Alert and oriented x 3. No focal neuro deficits noted. PSYCHIATRIC:  Normal affect    ASSESSMENT AND PLAN: .    CAD, with stable angina (DOE) Hypercholesterolemia -calcium score of 1416 in 2020, 99th percentile -stable shortness of breath with exertion, denies chest pain -reviewed red flag warning signs that need immediate medical attention -we discussed antianginals. Did not tolerate metoprolol due to mood side effects -he is tolerating atorvastatin 40 mg. Reviewed goal of LDL <70, last LDL 47 -discussed importance of tobacco cessation. He is not interested in quitting at this time -on aspirin 81 mg   Hypertension: near goal today -he self-discontinued amlodipine due to lightheadedness. -continue losartan-HCTZ 100/12.5 mg daily  Tobacco abuse: precontemplative   CV risk counseling and prevention -recommend heart healthy/Mediterranean diet, with whole grains, fruits, vegetable, fish, lean meats, nuts, and olive oil. Limit salt. -recommend moderate walking, 3-5 times/week for 30-50 minutes each session. Aim for at least 150 minutes.week. Goal should be pace of 3 miles/hours, or walking 1.5 miles in 30 minutes -recommend avoidance of tobacco products. Avoid excess alcohol.  Dispo: 12 mos  Signed, Jodelle Red, MD   Jodelle Red, MD, PhD, [facility]  [facility]  [facility]  [place]  [facility] & Vascular at [facility] at [facility] [address] [place], [place] [address] [phone]

## 2023-07-03 NOTE — Patient Instructions (Addendum)

## 2023-08-07 ENCOUNTER — Encounter: Payer: Self-pay | Admitting: Internal Medicine

## 2023-08-07 ENCOUNTER — Ambulatory Visit: Payer: BC Managed Care – PPO | Admitting: Internal Medicine

## 2023-08-07 VITALS — BP 120/60 | HR 74 | Temp 97.9°F | Ht 70.0 in | Wt 142.0 lb

## 2023-08-07 DIAGNOSIS — I2510 Atherosclerotic heart disease of native coronary artery without angina pectoris: Secondary | ICD-10-CM | POA: Diagnosis not present

## 2023-08-07 DIAGNOSIS — H9193 Unspecified hearing loss, bilateral: Secondary | ICD-10-CM

## 2023-08-07 DIAGNOSIS — I10 Essential (primary) hypertension: Secondary | ICD-10-CM

## 2023-08-07 DIAGNOSIS — H9190 Unspecified hearing loss, unspecified ear: Secondary | ICD-10-CM | POA: Insufficient documentation

## 2023-08-07 DIAGNOSIS — G4700 Insomnia, unspecified: Secondary | ICD-10-CM | POA: Diagnosis not present

## 2023-08-07 DIAGNOSIS — F17200 Nicotine dependence, unspecified, uncomplicated: Secondary | ICD-10-CM | POA: Diagnosis not present

## 2023-08-07 DIAGNOSIS — R062 Wheezing: Secondary | ICD-10-CM

## 2023-08-07 MED ORDER — AIRSUPRA 90-80 MCG/ACT IN AERO: 2.0000 | INHALATION_SPRAY | RESPIRATORY_TRACT | 3 refills | Status: DC | PRN

## 2023-08-07 NOTE — Assessment & Plan Note (Signed)
Pt has hearing aids

## 2023-08-07 NOTE — Assessment & Plan Note (Addendum)
On Trelegy Added Airsupra prn

## 2023-08-07 NOTE — Assessment & Plan Note (Signed)
F/u w/Dr Cristal Deer Coronary calcium score of 1416 - 2020 On Atorvastatin, ASA

## 2023-08-07 NOTE — Assessment & Plan Note (Signed)
1.5 ppd in 2024 F/u w/Dr Cristal Deer On ASA, Lipitor

## 2023-08-07 NOTE — Assessment & Plan Note (Signed)
Xanax prn  Potential benefits of a long term benzodiazepines  use as well as potential risks  and complications were explained to the patient and were aknowledged. 

## 2023-08-07 NOTE — Assessment & Plan Note (Signed)
1.5 PPD smoker

## 2023-08-07 NOTE — Progress Notes (Signed)
Subjective:  Patient ID: Kyle Stafford, male    DOB: Feb 06, 1961  Age: 62 y.o. MRN: [id number]  CC: Medical Management of Chronic Issues (6 mnth f/u)   HPI Kyle Stafford presents for smoking, CAD, insomnia F/u on COPD  Outpatient Medications Prior to Visit  Medication Sig Dispense Refill   ALPRAZolam (XANAX) 0.5 MG tablet Take 1 tablet (0.5 mg total) by mouth at bedtime as needed for anxiety or sleep. 90 tablet 1   aspirin EC 81 MG tablet Take 81 mg by mouth daily.     atorvastatin (LIPITOR) 40 MG tablet Take 1 tablet (40 mg total) by mouth daily. 90 tablet 3   b complex vitamins tablet Take 1 tablet by mouth daily. 100 tablet 3   Cholecalciferol (VITAMIN D3) 2000 units capsule Take 1 capsule (2,000 Units total) by mouth daily. 100 capsule 3   fluticasone (FLONASE) 50 MCG/ACT nasal spray Place into both nostrils as needed for allergies or rhinitis.     Fluticasone-Umeclidin-Vilant (TRELEGY ELLIPTA) 100-62.5-25 MCG/ACT AEPB Inhale 1 puff into the lungs daily. 2 each 8   loratadine (CLARITIN) 10 MG tablet Take 1 tablet (10 mg total) by mouth daily. 90 tablet 3   losartan-hydrochlorothiazide (HYZAAR) 100-12.5 MG tablet Take 1 tablet by mouth daily. 90 tablet 3   Melatonin-Pyridoxine (MELATIN PO) Take by mouth as needed.     tadalafil (CIALIS) 20 MG tablet Take 1 tablet (20 mg total) by mouth daily as needed for erectile dysfunction. 20 tablet 3   No facility-administered medications prior to visit.    ROS: Review of Systems  Constitutional:  Negative for appetite change, fatigue and unexpected weight change.  HENT:  Positive for hearing loss. Negative for congestion, nosebleeds, sneezing, sore throat and trouble swallowing.   Eyes:  Negative for itching and visual disturbance.  Respiratory:  Positive for wheezing. Negative for cough.   Cardiovascular:  Negative for chest pain, palpitations and leg swelling.  Gastrointestinal:  Negative for abdominal distention, blood in stool,  diarrhea and nausea.  Genitourinary:  Negative for frequency and hematuria.  Musculoskeletal:  Negative for back pain, gait problem, joint swelling and neck pain.  Skin:  Negative for rash.  Neurological:  Negative for dizziness, tremors, speech difficulty and weakness.  Psychiatric/Behavioral:  Negative for agitation, dysphoric mood and sleep disturbance. The patient is not nervous/anxious.     Objective:  BP 120/60 (BP Location: Left Arm, Patient Position: Sitting, Cuff Size: Normal)   Pulse 74   Temp 97.9 F (36.6 C) (Oral)   Ht 5\' 10"  (1.778 m)   Wt 142 lb (64.4 kg)   SpO2 92%   BMI 20.37 kg/m   BP Readings from Last 3 Encounters:  08/07/23 120/60  07/03/23 132/70  02/06/23 132/64    Wt Readings from Last 3 Encounters:  08/07/23 142 lb (64.4 kg)  07/03/23 149 lb (67.6 kg)  02/06/23 137 lb 8 oz (62.4 kg)    Physical Exam Constitutional:      General: He is not in acute distress.    Appearance: Normal appearance. He is well-developed.     Comments: NAD  Eyes:     Conjunctiva/sclera: Conjunctivae normal.     Pupils: Pupils are equal, round, and reactive to light.  Neck:     Thyroid: No thyromegaly.     Vascular: No JVD.  Cardiovascular:     Rate and Rhythm: Normal rate and regular rhythm.     Heart sounds: Normal heart sounds. No murmur heard.  No friction rub. No gallop.  Pulmonary:     Effort: Pulmonary effort is normal. No respiratory distress.     Breath sounds: Normal breath sounds. No wheezing or rales.  Chest:     Chest wall: No tenderness.  Abdominal:     General: Bowel sounds are normal. There is no distension.     Palpations: Abdomen is soft. There is no mass.     Tenderness: There is no abdominal tenderness. There is no guarding or rebound.  Musculoskeletal:        General: No tenderness. Normal range of motion.     Cervical back: Normal range of motion.  Lymphadenopathy:     Cervical: No cervical adenopathy.  Skin:    General: Skin is warm  and dry.     Findings: No rash.  Neurological:     Mental Status: He is alert and oriented to person, place, and time.     Cranial Nerves: No cranial nerve deficit.     Motor: No abnormal muscle tone.     Coordination: Coordination normal.     Gait: Gait normal.     Deep Tendon Reflexes: Reflexes are normal and symmetric.  Psychiatric:        Behavior: Behavior normal.        Thought Content: Thought content normal.        Judgment: Judgment normal.     Lab Results  Component Value Date   WBC 4.5 02/13/2023   HGB 15.3 02/13/2023   HCT 46.1 02/13/2023   PLT 227.0 02/13/2023   GLUCOSE 104 (H) 02/13/2023   CHOL 111 02/13/2023   TRIG 69.0 02/13/2023   HDL 39.30 02/13/2023   LDLDIRECT 104.1 05/15/2013   LDLCALC 58 02/13/2023   ALT 11 02/13/2023   AST 18 02/13/2023   NA 135 02/13/2023   K 4.8 02/13/2023   CL 96 02/13/2023   CREATININE 0.94 02/13/2023   BUN 11 02/13/2023   CO2 34 (H) 02/13/2023   TSH 3.03 02/13/2023   PSA 1.38 02/13/2023    CT Chest Wo Contrast  Result Date: 03/01/2022 CLINICAL DATA:  Evaluate left lung nodules noted in the left lower lobe on ct calcium scoring 10/16/2018. History of COPD. EXAM: CT CHEST WITHOUT CONTRAST TECHNIQUE: Multidetector CT imaging of the chest was performed following the standard protocol without IV contrast. RADIATION DOSE REDUCTION: This exam was performed according to the departmental dose-optimization program which includes automated exposure control, adjustment of the mA and/or kV according to patient size and/or use of iterative reconstruction technique. COMPARISON:  CT hard scoring 10/16/2018. FINDINGS: Cardiovascular: Multivessel coronary artery calcification. Mild scattered calcific atherosclerosis of the thoracic aorta. Normal heart size. No pericardial effusion. Mediastinum/Nodes: No enlarged mediastinal or axillary lymph nodes. Thyroid gland, trachea, and esophagus demonstrate no significant findings. Lungs/Pleura: Upper lobe  predominant mixed centrilobular and paraseptal emphysema. A 0.4 cm solid noncalcified nodule in the lateral right lower lobe is unchanged compared to 10/16/2018 (series 3, image 134). A 0.9 cm noncalcified nodule in the left lower lobe is also stable compared to 10/16/2018 (series 3, image 112). Additional 0.3 cm solid nodules in the upper lobes are also stable compared to prior exam (series 3, images 70, 103). No pleural effusion or pneumothorax. Upper Abdomen: No acute abnormality. Musculoskeletal: No chest wall mass or suspicious bone lesions identified. IMPRESSION: 1. Multiple bilateral pulmonary nodules are stable compared to 10/16/2018, consistent with benignity. No additional dedicated follow-up imaging is indicated. 2. Aortic Atherosclerosis (ICD10-I70.0) and Emphysema (ICD10-J43.9). 3.  Multivessel coronary artery disease. Electronically Signed   By: Sherron Ales M.D.   On: 03/01/2022 22:04    Assessment & Plan:   Problem List Items Addressed This Visit     TOBACCO USE DISORDER/SMOKER-SMOKING CESSATION DISCUSSED    1.5 PPD smoker      INSOMNIA, PERSISTENT    Xanax prn  Potential benefits of a long term benzodiazepines  use as well as potential risks  and complications were explained to the patient and were aknowledged.        Essential hypertension - Primary    F/u w/Dr Cristal Deer Coronary calcium score of 1416 - 2020 On Atorvastatin, ASA      Wheezing    On Trelegy Added Airupra prn      Coronary artery disease involving native coronary artery of native heart without angina pectoris    1.5 ppd in 2024 F/u w/Dr Cristal Deer On ASA, Lipitor         Meds ordered this encounter  Medications   Albuterol-Budesonide (AIRSUPRA) 90-80 MCG/ACT AERO    Sig: Inhale 2 Inhalations into the lungs every 4 (four) hours as needed.    Dispense:  30 g    Refill:  3      Follow-up: Return in about 6 months (around 02/05/2024) for Wellness Exam.  Sonda Primes, MD

## 2023-09-27 ENCOUNTER — Ambulatory Visit
Admission: EM | Admit: 2023-09-27 | Discharge: 2023-09-27 | Disposition: A | Discharge status: Home / Self Care | Payer: BC Managed Care – PPO | Attending: Physician Assistant | Admitting: Physician Assistant

## 2023-09-27 ENCOUNTER — Ambulatory Visit (INDEPENDENT_AMBULATORY_CARE_PROVIDER_SITE_OTHER): Payer: BC Managed Care – PPO

## 2023-09-27 DIAGNOSIS — J441 Chronic obstructive pulmonary disease with (acute) exacerbation: Secondary | ICD-10-CM | POA: Insufficient documentation

## 2023-09-27 LAB — POCT INFLUENZA A/B
Influenza A, POC: NEGATIVE
Influenza B, POC: NEGATIVE

## 2023-09-27 LAB — SARS CORONAVIRUS 2 BY RT PCR: SARS Coronavirus 2 by RT PCR: NEGATIVE

## 2023-09-27 NOTE — ED Provider Notes (Signed)
[facility]    CSN: [id number] Arrival date & time: 09/27/23  1506      History   Chief Complaint Chief Complaint  Patient presents with   Cough   Shortness of Breath    HPI Kyle Stafford is a 63 y.o. male.   Patient complains of a cough and congestion.  Patient complains of becoming short of breath whenever he is walking.  Patient has a past medical history of coronary artery disease hypertension and COPD.  Patient is concerned that he may have been exposed to the flu.  The history is provided by the patient. No language interpreter was used.  Cough Associated symptoms: shortness of breath   Shortness of Breath Associated symptoms: cough     Past Medical History:  Diagnosis Date   Hypertension    Lymphocytic colitis 02/20/2019   Personal history of colonic adenomas 07/05/2013    Patient Active Problem List   Diagnosis Date Noted   Hearing loss 08/07/2023   COPD exacerbation (HCC) 02/24/2022   Chest pain 02/24/2022   Pulmonary nodule, left 02/24/2022   Dyslipidemia 10/12/2020   Chronic sinusitis 10/12/2020   Lymphocytic colitis 02/20/2019   Coronary artery disease involving native coronary artery of native heart without angina pectoris 02/19/2019   Diarrhea 09/25/2018   Orchitis and epididymitis 05/26/2018   Tinea corporis 05/26/2018   Irritable bowel syndrome 05/26/2018   Shoulder weakness 09/19/2017   Alcoholism (HCC) 05/23/2014   History of colonic polyps 07/05/2013   Well adult exam 05/18/2012   PEYRONIES DISEASE 10/06/2009   Essential hypertension 04/02/2008   INSOMNIA, PERSISTENT 03/24/2008   GERD 03/24/2008   Wheezing 02/25/2008   TOBACCO USE DISORDER/SMOKER-SMOKING CESSATION DISCUSSED 12/18/2007   ERECTILE DYSFUNCTION 12/18/2007    Past Surgical History:  Procedure Laterality Date   EYE SURGERY     as a toddler   knee injury     piece of steel removed       Home Medications    Prior to Admission medications   Medication  Sig Start Date End Date Taking? Authorizing Provider  Albuterol-Budesonide (AIRSUPRA) 90-80 MCG/ACT AERO Inhale 2 Inhalations into the lungs every 4 (four) hours as needed. 08/07/23   Plotnikov, Georgina Quint, MD  ALPRAZolam Prudy Feeler) 0.5 MG tablet Take 1 tablet (0.5 mg total) by mouth at bedtime as needed for anxiety or sleep. 02/06/23   Plotnikov, Georgina Quint, MD  aspirin EC 81 MG tablet Take 81 mg by mouth daily.    [provider]  atorvastatin (LIPITOR) 40 MG tablet Take 1 tablet (40 mg total) by mouth daily. 02/16/23   Plotnikov, Georgina Quint, MD  b complex vitamins tablet Take 1 tablet by mouth daily. 09/19/17   Plotnikov, Georgina Quint, MD  Cholecalciferol (VITAMIN D3) 2000 units capsule Take 1 capsule (2,000 Units total) by mouth daily. 09/19/17   Plotnikov, Georgina Quint, MD  fluticasone (FLONASE) 50 MCG/ACT nasal spray Place into both nostrils as needed for allergies or rhinitis.    [provider]  Fluticasone-Umeclidin-Vilant (TRELEGY ELLIPTA) 100-62.5-25 MCG/ACT AEPB Inhale 1 puff into the lungs daily. 04/13/23   Plotnikov, Georgina Quint, MD  loratadine (CLARITIN) 10 MG tablet Take 1 tablet (10 mg total) by mouth daily. 10/28/21   Plotnikov, Georgina Quint, MD  losartan-hydrochlorothiazide (HYZAAR) 100-12.5 MG tablet Take 1 tablet by mouth daily. 02/16/23   Plotnikov, Georgina Quint, MD  Melatonin-Pyridoxine (MELATIN PO) Take by mouth as needed.    [provider]  tadalafil (CIALIS) 20 MG tablet Take  1 tablet (20 mg total) by mouth daily as needed for erectile dysfunction. 02/06/23 03/08/23  Plotnikov, Georgina Quint, MD    Family History Family History  Adopted: Yes  Problem Relation Age of Onset   Stroke Mother    Hypertension Mother    Heart disease Father    Stroke Father    Stroke Sister    Hypertension Sister    Colon cancer Neg Hx     Social History Social History   Tobacco Use   Smoking status: Every Day    Current packs/day: 1.50    Types: Cigarettes   Smokeless tobacco: Never   Vaping Use   Vaping status: Never Used  Substance Use Topics   Alcohol use: Yes    Alcohol/week: 21.0 standard drinks of alcohol    Types: 21 Cans of beer per week    Comment: 2 nights a week   Drug use: No     Allergies   Bupropion hcl and Penicillins   Review of Systems Review of Systems  Respiratory:  Positive for cough and shortness of breath.   All other systems reviewed and are negative.    Physical Exam Triage Vital Signs ED Triage Vitals  Encounter Vitals Group     BP 09/27/23 1538 132/80     Systolic BP Percentile --      Diastolic BP Percentile --      Pulse Rate 09/27/23 1538 81     Resp 09/27/23 1538 16     Temp 09/27/23 1538 97.9 F (36.6 C)     Temp Source 09/27/23 1538 Oral     SpO2 09/27/23 1538 91 %     Weight --      Height --      Head Circumference --      Peak Flow --      Pain Score 09/27/23 1537 4     Pain Loc --      Pain Education --      Exclude from Growth Chart --    No data found.  Updated Vital Signs BP 132/80 (BP Location: Left Arm)   Pulse 81   Temp 97.9 F (36.6 C) (Oral)   Resp 16   SpO2 91%   Visual Acuity Right Eye Distance:   Left Eye Distance:   Bilateral Distance:    Right Eye Near:   Left Eye Near:    Bilateral Near:     Physical Exam Vitals and nursing note reviewed.  Constitutional:      Appearance: He is well-developed.  HENT:     Head: Normocephalic.  Cardiovascular:     Rate and Rhythm: Normal rate.  Pulmonary:     Effort: Pulmonary effort is normal.     Breath sounds: Wheezing and rhonchi present. No decreased breath sounds.  Abdominal:     General: There is no distension.  Musculoskeletal:        General: Normal range of motion.     Cervical back: Normal range of motion.  Skin:    General: Skin is warm.  Neurological:     General: No focal deficit present.     Mental Status: He is alert and oriented to person, place, and time.      UC Treatments / Results  Labs (all labs ordered  are listed, but only abnormal results are displayed) Labs Reviewed  SARS CORONAVIRUS 2 BY RT PCR  POCT INFLUENZA A/B  Influenza is negative  EKG   Radiology DG Chest 2  View Result Date: 09/27/2023 CLINICAL DATA:  Cough. EXAM: CHEST - 2 VIEW COMPARISON:  02/06/2023 and CT chest 02/28/2022. FINDINGS: Trachea is midline. Heart size normal. Lungs are hyperinflated and emphysematous. No airspace consolidation or pleural fluid. IMPRESSION: 1. No acute findings. 2. Emphysema (ICD10-J43.9). Low-dose CT lung cancer screening is recommended for patients who are 72-23 years of age with a 20+ pack-year history of smoking and who are currently smoking or quit <=15 years ago. Electronically Signed   By: Leanna Battles M.D.   On: 09/27/2023 18:11    Procedures Procedures (including critical care time)  Medications Ordered in UC Medications - No data to display  Initial Impression / Assessment and Plan / UC Course  I have reviewed the triage vital signs and the nursing notes.  Pertinent labs & imaging results that were available during my care of the patient were reviewed by me and considered in my medical decision making (see chart for details).     Chest x-ray read by me shows severe COPD.  No obvious pneumonia.  I will treat patient with doxycycline and prednisone.  Patient is advised to use his albuterol inhaler every 4 hours.  Patient advised to follow-up with his primary care physician for recheck. Final Clinical Impressions(s) / UC Diagnoses   Final diagnoses:  COPD exacerbation [facility])     Discharge Instructions      Use your inhaler every 4 hours.  See your Physicain for recheck    ED Prescriptions   None    PDMP not reviewed this encounter. An After Visit Summary was printed and given to the patient.       Elson Areas, [place] 09/27/23 1820

## 2023-09-27 NOTE — ED Triage Notes (Addendum)
Pt presents with c/o chills, headaches and cough x 4 days and developed SOB. Pt concerned he has the flu   Home interventions: Cold and Flu medicine

## 2023-09-27 NOTE — Discharge Instructions (Addendum)
Use your inhaler every 4 hours.  See your Physicain for recheck

## 2023-09-28 ENCOUNTER — Telehealth: Payer: Self-pay | Admitting: Family Medicine

## 2023-09-28 ENCOUNTER — Telehealth: Payer: Self-pay | Admitting: *Deleted

## 2023-09-28 MED ORDER — DOXYCYCLINE HYCLATE 100 MG PO CAPS: 100.0000 mg | ORAL_CAPSULE | Freq: Two times a day (BID) | ORAL | 0 refills | Status: DC

## 2023-09-28 MED ORDER — PREDNISONE 20 MG PO TABS: 40.0000 mg | ORAL_TABLET | Freq: Every day | ORAL | 0 refills | Status: AC

## 2023-09-28 NOTE — Telephone Encounter (Signed)
Apparently meds from yest visit were not sent or received; unclear. Sent to pharmacy on file: Meds ordered this encounter  Medications   doxycycline (VIBRAMYCIN) 100 MG capsule    Sig: Take 1 capsule (100 mg total) by mouth 2 (two) times daily.    Dispense:  14 capsule    Refill:  0   predniSONE (DELTASONE) 20 MG tablet    Sig: Take 2 tablets (40 mg total) by mouth daily.    Dispense:  10 tablet    Refill:  0

## 2023-09-28 NOTE — Telephone Encounter (Signed)
I spoke with Kyle Stafford and advised him prescriptions have been sent in to [facility] on [address].

## 2024-05-10 ENCOUNTER — Other Ambulatory Visit: Payer: Self-pay | Admitting: Internal Medicine

## 2024-05-10 DIAGNOSIS — I2510 Atherosclerotic heart disease of native coronary artery without angina pectoris: Secondary | ICD-10-CM

## 2024-05-10 DIAGNOSIS — I10 Essential (primary) hypertension: Secondary | ICD-10-CM

## 2024-05-10 NOTE — Telephone Encounter (Signed)
 Sent MyChart message for patient to call office and set up Physical appointment for any refills

## 2024-05-16 ENCOUNTER — Ambulatory Visit (INDEPENDENT_AMBULATORY_CARE_PROVIDER_SITE_OTHER): Admitting: Internal Medicine

## 2024-05-16 ENCOUNTER — Encounter: Payer: Self-pay | Admitting: Internal Medicine

## 2024-05-16 VITALS — BP 140/72 | HR 64 | Temp 98.6°F | Ht 70.0 in | Wt 154.2 lb

## 2024-05-16 DIAGNOSIS — E785 Hyperlipidemia, unspecified: Secondary | ICD-10-CM | POA: Diagnosis not present

## 2024-05-16 DIAGNOSIS — J441 Chronic obstructive pulmonary disease with (acute) exacerbation: Secondary | ICD-10-CM

## 2024-05-16 DIAGNOSIS — I2584 Coronary atherosclerosis due to calcified coronary lesion: Secondary | ICD-10-CM

## 2024-05-16 DIAGNOSIS — I2510 Atherosclerotic heart disease of native coronary artery without angina pectoris: Secondary | ICD-10-CM

## 2024-05-16 DIAGNOSIS — Z0000 Encounter for general adult medical examination without abnormal findings: Secondary | ICD-10-CM | POA: Diagnosis not present

## 2024-05-16 DIAGNOSIS — I10 Essential (primary) hypertension: Secondary | ICD-10-CM

## 2024-05-16 MED ORDER — ATORVASTATIN CALCIUM 40 MG PO TABS: 40.0000 mg | ORAL_TABLET | Freq: Every day | ORAL | 3 refills | Status: AC

## 2024-05-16 MED ORDER — TRELEGY ELLIPTA 100-62.5-25 MCG/ACT IN AEPB: 1.0000 | INHALATION_SPRAY | Freq: Every day | RESPIRATORY_TRACT | 8 refills | Status: AC

## 2024-05-16 MED ORDER — ALPRAZOLAM 0.5 MG PO TABS: 0.5000 mg | ORAL_TABLET | Freq: Every evening | ORAL | 1 refills | Status: AC | PRN

## 2024-05-16 MED ORDER — LOSARTAN POTASSIUM-HCTZ 100-12.5 MG PO TABS: 1.0000 | ORAL_TABLET | Freq: Every day | ORAL | 3 refills | Status: AC

## 2024-05-16 MED ORDER — FLUTICASONE PROPIONATE 50 MCG/ACT NA SUSP: 2.0000 | NASAL | 11 refills | Status: AC | PRN

## 2024-05-16 MED ORDER — AIRSUPRA 90-80 MCG/ACT IN AERO: 2.0000 | INHALATION_SPRAY | RESPIRATORY_TRACT | 3 refills | Status: AC | PRN

## 2024-05-16 NOTE — Assessment & Plan Note (Signed)
  We discussed age appropriate health related issues, including available/recomended screening tests and vaccinations. Labs were ordered to be later reviewed . All questions were answered. We discussed one or more of the following - seat belt use, use of sunscreen/sun exposure exercise, safe sex, fall risk reduction, second hand smoke exposure, firearm use and storage, seat belt use, a need for adhering to healthy diet and exercise. Labs were ordered.  All questions were answered. On ASA, Lipitor <2 ppd smoker - needs to cut back and stop. Prevnar offered Colonoscopy 2014, 2020 due in 2030 - Dr Avram Pt declined a rectal exam

## 2024-05-16 NOTE — Progress Notes (Signed)
 Subjective:  Patient ID: Kyle Stafford, male    DOB: June 29, 1961  Age: 63 y.o. MRN: [id number]  CC: Annual Exam   HPI Kyle Stafford presents for a well exam  Outpatient Medications Prior to Visit  Medication Sig Dispense Refill   aspirin  EC 81 MG tablet Take 81 mg by mouth daily.     b complex vitamins tablet Take 1 tablet by mouth daily. 100 tablet 3   Cholecalciferol (VITAMIN D3) 2000 units capsule Take 1 capsule (2,000 Units total) by mouth daily. 100 capsule 3   tadalafil  (CIALIS ) 20 MG tablet Take 1 tablet (20 mg total) by mouth daily as needed for erectile dysfunction. 20 tablet 3   Albuterol -Budesonide (AIRSUPRA ) 90-80 MCG/ACT AERO Inhale 2 Inhalations into the lungs every 4 (four) hours as needed. 30 g 3   ALPRAZolam  (XANAX ) 0.5 MG tablet Take 1 tablet (0.5 mg total) by mouth at bedtime as needed for anxiety or sleep. 90 tablet 1   atorvastatin  (LIPITOR) 40 MG tablet TAKE 1 TABLET DAILY 90 tablet 3   fluticasone  (FLONASE ) 50 MCG/ACT nasal spray Place into both nostrils as needed for allergies or rhinitis.     Fluticasone -Umeclidin-Vilant (TRELEGY ELLIPTA ) 100-62.5-25 MCG/ACT AEPB Inhale 1 puff into the lungs daily. 2 each 8   losartan -hydrochlorothiazide (HYZAAR) 100-12.5 MG tablet TAKE 1 TABLET DAILY 90 tablet 3   doxycycline  (VIBRAMYCIN ) 100 MG capsule Take 1 capsule (100 mg total) by mouth 2 (two) times daily. (Patient not taking: Reported on 05/16/2024) 14 capsule 0   loratadine  (CLARITIN ) 10 MG tablet Take 1 tablet (10 mg total) by mouth daily. (Patient not taking: Reported on 05/16/2024) 90 tablet 3   Melatonin-Pyridoxine (MELATIN PO) Take by mouth as needed.     predniSONE  (DELTASONE ) 20 MG tablet Take 2 tablets (40 mg total) by mouth daily. (Patient not taking: Reported on 05/16/2024) 10 tablet 0   No facility-administered medications prior to visit.    ROS: Review of Systems  Constitutional:  Negative for appetite change, fatigue and unexpected weight change.  HENT:   Negative for congestion, dental problem, nosebleeds, sneezing, sore throat and trouble swallowing.   Eyes:  Negative for itching and visual disturbance.  Respiratory:  Negative for cough.   Cardiovascular:  Negative for chest pain, palpitations and leg swelling.  Gastrointestinal:  Negative for abdominal distention, blood in stool, diarrhea and nausea.  Genitourinary:  Negative for frequency and hematuria.  Musculoskeletal:  Negative for back pain, gait problem, joint swelling and neck pain.  Skin:  Negative for rash.  Neurological:  Negative for dizziness, tremors, speech difficulty and weakness.  Psychiatric/Behavioral:  Negative for agitation, dysphoric mood and sleep disturbance. The patient is not nervous/anxious.     Objective:  BP (!) 140/72   Pulse 64   Temp 98.6 F (37 C) (Oral)   Ht 5' 10 (1.778 m)   Wt 154 lb 3.2 oz (69.9 kg)   SpO2 95%   BMI 22.13 kg/m   BP Readings from Last 3 Encounters:  05/16/24 (!) 140/72  09/27/23 132/80  08/07/23 120/60    Wt Readings from Last 3 Encounters:  05/16/24 154 lb 3.2 oz (69.9 kg)  08/07/23 142 lb (64.4 kg)  07/03/23 149 lb (67.6 kg)    Physical Exam Constitutional:      General: He is not in acute distress.    Appearance: He is well-developed.     Comments: NAD  Eyes:     Conjunctiva/sclera: Conjunctivae normal.  Pupils: Pupils are equal, round, and reactive to light.  Neck:     Thyroid : No thyromegaly.     Vascular: No JVD.  Cardiovascular:     Rate and Rhythm: Normal rate and regular rhythm.     Heart sounds: Normal heart sounds. No murmur heard.    No friction rub. No gallop.  Pulmonary:     Effort: Pulmonary effort is normal. No respiratory distress.     Breath sounds: Normal breath sounds. No wheezing or rales.  Chest:     Chest wall: No tenderness.  Abdominal:     General: Bowel sounds are normal. There is no distension.     Palpations: Abdomen is soft. There is no mass.     Tenderness: There is no  abdominal tenderness. There is no guarding or rebound.  Musculoskeletal:        General: No tenderness. Normal range of motion.     Cervical back: Normal range of motion.  Lymphadenopathy:     Cervical: No cervical adenopathy.  Skin:    General: Skin is warm and dry.     Findings: No rash.  Neurological:     Mental Status: He is alert and oriented to person, place, and time.     Cranial Nerves: No cranial nerve deficit.     Motor: No abnormal muscle tone.     Coordination: Coordination normal.     Gait: Gait normal.     Deep Tendon Reflexes: Reflexes are normal and symmetric.  Psychiatric:        Behavior: Behavior normal.        Thought Content: Thought content normal.        Judgment: Judgment normal.   Hard hearing Pt refused rectal exam   Lab Results  Component Value Date   WBC 4.5 02/13/2023   HGB 15.3 02/13/2023   HCT 46.1 02/13/2023   PLT 227.0 02/13/2023   GLUCOSE 104 (H) 02/13/2023   CHOL 111 02/13/2023   TRIG 69.0 02/13/2023   HDL 39.30 02/13/2023   LDLDIRECT 104.1 05/15/2013   LDLCALC 58 02/13/2023   ALT 11 02/13/2023   AST 18 02/13/2023   NA 135 02/13/2023   K 4.8 02/13/2023   CL 96 02/13/2023   CREATININE 0.94 02/13/2023   BUN 11 02/13/2023   CO2 34 (H) 02/13/2023   TSH 3.03 02/13/2023   PSA 1.38 02/13/2023    DG Chest 2 View Result Date: 09/27/2023 CLINICAL DATA:  Cough. EXAM: CHEST - 2 VIEW COMPARISON:  02/06/2023 and CT chest 02/28/2022. FINDINGS: Trachea is midline. Heart size normal. Lungs are hyperinflated and emphysematous. No airspace consolidation or pleural fluid. IMPRESSION: 1. No acute findings. 2. Emphysema (ICD10-J43.9). Low-dose CT lung cancer screening is recommended for patients who are 65-65 years of age with a 20+ pack-year history of smoking and who are currently smoking or quit <=15 years ago. Electronically Signed   By: Newell Eke M.D.   On: 09/27/2023 18:11    Assessment & Plan:   Problem List Items Addressed This Visit      COPD exacerbation (HCC)   Relevant Medications   Albuterol -Budesonide (AIRSUPRA ) 90-80 MCG/ACT AERO   fluticasone  (FLONASE ) 50 MCG/ACT nasal spray   Fluticasone -Umeclidin-Vilant (TRELEGY ELLIPTA ) 100-62.5-25 MCG/ACT AEPB   Other Relevant Orders   Testosterone    Dyslipidemia   Relevant Medications   atorvastatin  (LIPITOR) 40 MG tablet   Essential hypertension   Relevant Medications   atorvastatin  (LIPITOR) 40 MG tablet   losartan -hydrochlorothiazide (HYZAAR) 100-12.5 MG tablet  Well adult exam - Primary    We discussed age appropriate health related issues, including available/recomended screening tests and vaccinations. Labs were ordered to be later reviewed . All questions were answered. We discussed one or more of the following - seat belt use, use of sunscreen/sun exposure exercise, safe sex, fall risk reduction, second hand smoke exposure, firearm use and storage, seat belt use, a need for adhering to healthy diet and exercise. Labs were ordered.  All questions were answered. On ASA, Lipitor <2 ppd smoker - needs to cut back and stop. Prevnar offered Colonoscopy 2014, 2020 due in 2030 - Dr Avram Pt declined a rectal exam       Relevant Orders   TSH   Urinalysis   CBC with Differential/Platelet   Lipid panel   PSA   Comprehensive metabolic panel with GFR   Testosterone    VITAMIN D  25 Hydroxy (Vit-D Deficiency, Fractures)   Urinalysis   Other Visit Diagnoses       Coronary artery disease due to calcified coronary lesion       Relevant Medications   atorvastatin  (LIPITOR) 40 MG tablet   losartan -hydrochlorothiazide (HYZAAR) 100-12.5 MG tablet         Meds ordered this encounter  Medications   Albuterol -Budesonide (AIRSUPRA ) 90-80 MCG/ACT AERO    Sig: Inhale 2 Inhalations into the lungs every 4 (four) hours as needed.    Dispense:  30 g    Refill:  3   ALPRAZolam  (XANAX ) 0.5 MG tablet    Sig: Take 1 tablet (0.5 mg total) by mouth at bedtime as needed for  anxiety or sleep.    Dispense:  90 tablet    Refill:  1   atorvastatin  (LIPITOR) 40 MG tablet    Sig: Take 1 tablet (40 mg total) by mouth daily.    Dispense:  90 tablet    Refill:  3   fluticasone  (FLONASE ) 50 MCG/ACT nasal spray    Sig: Place 2 sprays into both nostrils as needed for allergies or rhinitis.    Dispense:  30 mL    Refill:  11   Fluticasone -Umeclidin-Vilant (TRELEGY ELLIPTA ) 100-62.5-25 MCG/ACT AEPB    Sig: Inhale 1 puff into the lungs daily.    Dispense:  2 each    Refill:  8   losartan -hydrochlorothiazide (HYZAAR) 100-12.5 MG tablet    Sig: Take 1 tablet by mouth daily.    Dispense:  90 tablet    Refill:  3      Follow-up: No follow-ups on file. Marolyn Noel, MD

## 2024-06-07 ENCOUNTER — Other Ambulatory Visit

## 2024-06-07 DIAGNOSIS — J441 Chronic obstructive pulmonary disease with (acute) exacerbation: Secondary | ICD-10-CM | POA: Diagnosis not present

## 2024-06-07 DIAGNOSIS — Z0000 Encounter for general adult medical examination without abnormal findings: Secondary | ICD-10-CM

## 2024-06-07 LAB — URINALYSIS
Bilirubin Urine: NEGATIVE
Hgb urine dipstick: NEGATIVE
Ketones, ur: NEGATIVE
Leukocytes,Ua: NEGATIVE
Nitrite: NEGATIVE
Specific Gravity, Urine: 1.015 (ref 1.000–1.030)
Total Protein, Urine: NEGATIVE
Urine Glucose: NEGATIVE
Urobilinogen, UA: 4 — AB (ref 0.0–1.0)
pH: 6.5 (ref 5.0–8.0)

## 2024-06-07 LAB — COMPREHENSIVE METABOLIC PANEL WITH GFR
ALT: 11 U/L (ref 0–53)
AST: 19 U/L (ref 0–37)
Albumin: 4.1 g/dL (ref 3.5–5.2)
Alkaline Phosphatase: 67 U/L (ref 39–117)
BUN: 10 mg/dL (ref 6–23)
CO2: 30 meq/L (ref 19–32)
Calcium: 9.1 mg/dL (ref 8.4–10.5)
Chloride: 91 meq/L — ABNORMAL LOW (ref 96–112)
Creatinine, Ser: 0.85 mg/dL (ref 0.40–1.50)
GFR: 93.82 mL/min (ref >60.00)
Glucose, Bld: 122 mg/dL — ABNORMAL HIGH (ref 70–99)
Potassium: 4.1 meq/L (ref 3.5–5.1)
Sodium: 130 meq/L — ABNORMAL LOW (ref 135–145)
Total Bilirubin: 1.3 mg/dL — ABNORMAL HIGH (ref 0.2–1.2)
Total Protein: 6.9 g/dL (ref 6.0–8.3)

## 2024-06-07 LAB — CBC WITH DIFFERENTIAL/PLATELET
Basophils Absolute: 0 K/uL (ref 0.0–0.1)
Basophils Relative: 0.1 % (ref 0.0–3.0)
Eosinophils Absolute: 0 K/uL (ref 0.0–0.7)
Eosinophils Relative: 0.3 % (ref 0.0–5.0)
HCT: 46.3 % (ref 39.0–52.0)
Hemoglobin: 15.6 g/dL (ref 13.0–17.0)
Lymphocytes Relative: 8.8 % — ABNORMAL LOW (ref 12.0–46.0)
Lymphs Abs: 0.9 K/uL (ref 0.7–4.0)
MCHC: 33.7 g/dL (ref 30.0–36.0)
MCV: 100.9 fl — ABNORMAL HIGH (ref 78.0–100.0)
Monocytes Absolute: 0.9 K/uL (ref 0.1–1.0)
Monocytes Relative: 8.7 % (ref 3.0–12.0)
Neutro Abs: 8.3 K/uL — ABNORMAL HIGH (ref 1.4–7.7)
Neutrophils Relative %: 82.1 % — ABNORMAL HIGH (ref 43.0–77.0)
Platelets: 205 K/uL (ref 150.0–400.0)
RBC: 4.58 Mil/uL (ref 4.22–5.81)
RDW: 12.8 % (ref 11.5–15.5)
WBC: 10.1 K/uL (ref 4.0–10.5)

## 2024-06-07 LAB — LIPID PANEL
Cholesterol: 134 mg/dL (ref 0–200)
HDL: 42.8 mg/dL (ref >39.00)
LDL Cholesterol: 73 mg/dL (ref 0–99)
NonHDL: 91.19
Total CHOL/HDL Ratio: 3
Triglycerides: 92 mg/dL (ref 0.0–149.0)
VLDL: 18.4 mg/dL (ref 0.0–40.0)

## 2024-06-07 LAB — TSH: TSH: 1.48 u[IU]/mL (ref 0.35–5.50)

## 2024-06-07 LAB — TESTOSTERONE: Testosterone: 242.76 ng/dL — ABNORMAL LOW (ref 300.00–890.00)

## 2024-06-07 LAB — VITAMIN D 25 HYDROXY (VIT D DEFICIENCY, FRACTURES): VITD: 30.83 ng/mL (ref 30.00–100.00)

## 2024-06-07 LAB — PSA: PSA: 1.06 ng/mL (ref 0.10–4.00)

## 2024-06-08 ENCOUNTER — Telehealth: Admitting: Family Medicine

## 2024-06-08 DIAGNOSIS — R509 Fever, unspecified: Secondary | ICD-10-CM

## 2024-06-08 DIAGNOSIS — J019 Acute sinusitis, unspecified: Secondary | ICD-10-CM | POA: Diagnosis not present

## 2024-06-08 DIAGNOSIS — B9689 Other specified bacterial agents as the cause of diseases classified elsewhere: Secondary | ICD-10-CM | POA: Diagnosis not present

## 2024-06-08 DIAGNOSIS — R051 Acute cough: Secondary | ICD-10-CM | POA: Diagnosis not present

## 2024-06-08 MED ORDER — DOXYCYCLINE HYCLATE 100 MG PO CAPS: 100.0000 mg | ORAL_CAPSULE | Freq: Two times a day (BID) | ORAL | 0 refills | Status: AC

## 2024-06-08 NOTE — Progress Notes (Signed)
 Virtual Visit Consent   [name], you are scheduled for a virtual visit with a [facility] provider today. Just as with appointments in the office, your consent must be obtained to participate. Your consent will be active for this visit and any virtual visit you may have with one of our providers in the next 365 days. If you have a MyChart account, a copy of this consent can be sent to you electronically.  As this is a virtual visit, video technology does not allow for your provider to perform a traditional examination. This may limit your provider's ability to fully assess your condition. If your provider identifies any concerns that need to be evaluated in person or the need to arrange testing (such as labs, EKG, etc.), we will make arrangements to do so. Although advances in technology are sophisticated, we cannot ensure that it will always work on either your end or our end. If the connection with a video visit is poor, the visit may have to be switched to a telephone visit. With either a video or telephone visit, we are not always able to ensure that we have a secure connection.  By engaging in this virtual visit, you consent to the provision of healthcare and authorize for your insurance to be billed (if applicable) for the services provided during this visit. Depending on your insurance coverage, you may receive a charge related to this service.  I need to obtain your verbal consent now. Are you willing to proceed with your visit today? MANDO BLATZ has provided verbal consent on 06/08/2024 for a virtual visit (video or telephone). Loa Lamp, FNP  Date: 06/08/2024 5:08 PM   Virtual Visit via Video Note   I, Loa Lamp, connected with  Kyle Stafford  ([id number], 1960-10-09) on 06/08/24 at  5:00 PM EDT by a video-enabled telemedicine application and verified that I am speaking with the correct person using two identifiers.  Location: Patient: Virtual Visit Location Patient:  Home Provider: Virtual Visit Location Provider: Home Office   I discussed the limitations of evaluation and management by telemedicine and the availability of in person appointments. The patient expressed understanding and agreed to proceed.    History of Present Illness: Kyle Stafford is a 63 y.o. who identifies as a male who was assigned male at birth, and is being seen today for sinus pressure and pain, stuffy nose, cough, green mucus, he feels like he has a rock in left sinus and pressure in gums. No fever. Sx for almost a week worsening. Has not checked covid or flu. Left ear pain present- no drainage. Very fatigued. [name]  HPI: HPI  Problems:  Patient Active Problem List   Diagnosis Date Noted   Hearing loss 08/07/2023   COPD exacerbation (HCC) 02/24/2022   Chest pain 02/24/2022   Pulmonary nodule, left 02/24/2022   Dyslipidemia 10/12/2020   Chronic sinusitis 10/12/2020   Lymphocytic colitis 02/20/2019   Coronary artery disease involving native coronary artery of native heart without angina pectoris 02/19/2019   Diarrhea 09/25/2018   Orchitis and epididymitis 05/26/2018   Tinea corporis 05/26/2018   Irritable bowel syndrome 05/26/2018   Shoulder weakness 09/19/2017   Alcoholism (HCC) 05/23/2014   History of colonic polyps 07/05/2013   Well adult exam 05/18/2012   PEYRONIES DISEASE 10/06/2009   Essential hypertension 04/02/2008   INSOMNIA, PERSISTENT 03/24/2008   GERD 03/24/2008   Wheezing 02/25/2008   TOBACCO USE DISORDER/SMOKER-SMOKING CESSATION DISCUSSED 12/18/2007   ERECTILE DYSFUNCTION 12/18/2007  Allergies:  Allergies  Allergen Reactions   Bupropion Hcl     REACTION: not feeling well   Penicillins    Medications:  Current Outpatient Medications:    Albuterol -Budesonide (AIRSUPRA ) 90-80 MCG/ACT AERO, Inhale 2 Inhalations into the lungs every 4 (four) hours as needed., Disp: 30 g, Rfl: 3   ALPRAZolam  (XANAX ) 0.5 MG tablet, Take 1 tablet (0.5 mg total) by mouth  at bedtime as needed for anxiety or sleep., Disp: 90 tablet, Rfl: 1   aspirin  EC 81 MG tablet, Take 81 mg by mouth daily., Disp: , Rfl:    atorvastatin  (LIPITOR) 40 MG tablet, Take 1 tablet (40 mg total) by mouth daily., Disp: 90 tablet, Rfl: 3   b complex vitamins tablet, Take 1 tablet by mouth daily., Disp: 100 tablet, Rfl: 3   Cholecalciferol (VITAMIN D3) 2000 units capsule, Take 1 capsule (2,000 Units total) by mouth daily., Disp: 100 capsule, Rfl: 3   doxycycline  (VIBRAMYCIN ) 100 MG capsule, Take 1 capsule (100 mg total) by mouth 2 (two) times daily. (Patient not taking: Reported on 05/16/2024), Disp: 14 capsule, Rfl: 0   fluticasone  (FLONASE ) 50 MCG/ACT nasal spray, Place 2 sprays into both nostrils as needed for allergies or rhinitis., Disp: 30 mL, Rfl: 11   Fluticasone -Umeclidin-Vilant (TRELEGY ELLIPTA ) 100-62.5-25 MCG/ACT AEPB, Inhale 1 puff into the lungs daily., Disp: 2 each, Rfl: 8   loratadine  (CLARITIN ) 10 MG tablet, Take 1 tablet (10 mg total) by mouth daily. (Patient not taking: Reported on 05/16/2024), Disp: 90 tablet, Rfl: 3   losartan -hydrochlorothiazide (HYZAAR) 100-12.5 MG tablet, Take 1 tablet by mouth daily., Disp: 90 tablet, Rfl: 3   Melatonin-Pyridoxine (MELATIN PO), Take by mouth as needed., Disp: , Rfl:    predniSONE  (DELTASONE ) 20 MG tablet, Take 2 tablets (40 mg total) by mouth daily. (Patient not taking: Reported on 05/16/2024), Disp: 10 tablet, Rfl: 0   tadalafil  (CIALIS ) 20 MG tablet, Take 1 tablet (20 mg total) by mouth daily as needed for erectile dysfunction., Disp: 20 tablet, Rfl: 3  Observations/Objective: Patient is well-developed, well-nourished in no acute distress.  Resting comfortably  at home.  Head is normocephalic, atraumatic.  No labored breathing.  Speech is clear and coherent with logical content.  Patient is alert and oriented at baseline.    Assessment and Plan: 1. Acute bacterial sinusitis (Primary)  2. Fever, unspecified fever cause  3.  Acute cough  Increase fluids, humidifier at night, tylenol or ibuprofen as directed UC of sx worsen.   Follow Up Instructions: I discussed the assessment and treatment plan with the patient. The patient was provided an opportunity to ask questions and all were answered. The patient agreed with the plan and demonstrated an understanding of the instructions.  A copy of instructions were sent to the patient via MyChart unless otherwise noted below.     The patient was advised to call back or seek an in-person evaluation if the symptoms worsen or if the condition fails to improve as anticipated.    Shavy Beachem, FNP

## 2024-06-08 NOTE — Patient Instructions (Signed)

## 2024-06-10 ENCOUNTER — Ambulatory Visit: Payer: Self-pay | Admitting: Internal Medicine

## 2024-06-10 DIAGNOSIS — R7989 Other specified abnormal findings of blood chemistry: Secondary | ICD-10-CM

## 2024-06-10 DIAGNOSIS — R739 Hyperglycemia, unspecified: Secondary | ICD-10-CM

## 2024-06-10 DIAGNOSIS — I10 Essential (primary) hypertension: Secondary | ICD-10-CM

## 2024-07-24 ENCOUNTER — Other Ambulatory Visit (HOSPITAL_COMMUNITY): Payer: Self-pay
